# Patient Record
Sex: Female | Born: 1945 | Race: White | Hispanic: No | Smoking: Never smoker
Health system: Southern US, Community
[De-identification: ages and names within clinical notes are randomized; demographics above are authoritative.]

## PROBLEM LIST (undated history)

## (undated) DIAGNOSIS — R002 Palpitations: Secondary | ICD-10-CM

## (undated) DIAGNOSIS — N952 Postmenopausal atrophic vaginitis: Secondary | ICD-10-CM

## (undated) DIAGNOSIS — Z800 Family history of malignant neoplasm of digestive organs: Secondary | ICD-10-CM

## (undated) DIAGNOSIS — M8580 Other specified disorders of bone density and structure, unspecified site: Secondary | ICD-10-CM

## (undated) DIAGNOSIS — F419 Anxiety disorder, unspecified: Secondary | ICD-10-CM

## (undated) DIAGNOSIS — N200 Calculus of kidney: Secondary | ICD-10-CM

## (undated) DIAGNOSIS — H04129 Dry eye syndrome of unspecified lacrimal gland: Secondary | ICD-10-CM

## (undated) DIAGNOSIS — I340 Nonrheumatic mitral (valve) insufficiency: Secondary | ICD-10-CM

## (undated) DIAGNOSIS — K219 Gastro-esophageal reflux disease without esophagitis: Secondary | ICD-10-CM

## (undated) DIAGNOSIS — M810 Age-related osteoporosis without current pathological fracture: Secondary | ICD-10-CM

## (undated) HISTORY — DX: Palpitations: R00.2

## (undated) HISTORY — DX: Anxiety disorder, unspecified: F41.9

## (undated) HISTORY — DX: Other specified disorders of bone density and structure, unspecified site: M85.80

## (undated) HISTORY — DX: Family history of malignant neoplasm of digestive organs: Z80.0

## (undated) HISTORY — DX: Calculus of kidney: N20.0

## (undated) HISTORY — DX: Nonrheumatic mitral (valve) insufficiency: I34.0

## (undated) HISTORY — PX: BREAST CYST ASPIRATION: SHX578

## (undated) HISTORY — DX: Dry eye syndrome of unspecified lacrimal gland: H04.129

## (undated) HISTORY — DX: Postmenopausal atrophic vaginitis: N95.2

## (undated) HISTORY — DX: Gastro-esophageal reflux disease without esophagitis: K21.9

## (undated) HISTORY — DX: Age-related osteoporosis without current pathological fracture: M81.0

---

## 2002-09-03 HISTORY — PX: OVARIAN CYST REMOVAL: SHX89

## 2011-02-28 DIAGNOSIS — K219 Gastro-esophageal reflux disease without esophagitis: Secondary | ICD-10-CM | POA: Insufficient documentation

## 2011-02-28 HISTORY — DX: Gastro-esophageal reflux disease without esophagitis: K21.9

## 2012-09-03 HISTORY — PX: ABDOMINAL HYSTERECTOMY: SHX81

## 2013-03-02 DIAGNOSIS — Z800 Family history of malignant neoplasm of digestive organs: Secondary | ICD-10-CM | POA: Insufficient documentation

## 2013-03-02 DIAGNOSIS — F419 Anxiety disorder, unspecified: Secondary | ICD-10-CM

## 2013-03-02 HISTORY — DX: Anxiety disorder, unspecified: F41.9

## 2013-03-02 HISTORY — DX: Family history of malignant neoplasm of digestive organs: Z80.0

## 2013-05-25 LAB — HM DEXA SCAN

## 2013-09-01 DIAGNOSIS — N390 Urinary tract infection, site not specified: Secondary | ICD-10-CM | POA: Insufficient documentation

## 2013-09-01 DIAGNOSIS — N200 Calculus of kidney: Secondary | ICD-10-CM

## 2013-09-01 HISTORY — DX: Calculus of kidney: N20.0

## 2015-03-01 LAB — HM MAMMOGRAPHY

## 2016-06-21 ENCOUNTER — Ambulatory Visit: Payer: Self-pay | Admitting: Osteopathic Medicine

## 2016-06-22 ENCOUNTER — Encounter: Payer: Self-pay | Admitting: Osteopathic Medicine

## 2016-06-22 ENCOUNTER — Ambulatory Visit (INDEPENDENT_AMBULATORY_CARE_PROVIDER_SITE_OTHER): Payer: Medicare Other | Admitting: Osteopathic Medicine

## 2016-06-22 VITALS — BP 116/62 | HR 68 | Ht 62.0 in | Wt 126.0 lb

## 2016-06-22 DIAGNOSIS — Z0000 Encounter for general adult medical examination without abnormal findings: Secondary | ICD-10-CM | POA: Diagnosis not present

## 2016-06-22 DIAGNOSIS — Z23 Encounter for immunization: Secondary | ICD-10-CM | POA: Diagnosis not present

## 2016-06-22 DIAGNOSIS — H04129 Dry eye syndrome of unspecified lacrimal gland: Secondary | ICD-10-CM

## 2016-06-22 DIAGNOSIS — I340 Nonrheumatic mitral (valve) insufficiency: Secondary | ICD-10-CM

## 2016-06-22 DIAGNOSIS — M810 Age-related osteoporosis without current pathological fracture: Secondary | ICD-10-CM

## 2016-06-22 DIAGNOSIS — M8580 Other specified disorders of bone density and structure, unspecified site: Secondary | ICD-10-CM

## 2016-06-22 DIAGNOSIS — R002 Palpitations: Secondary | ICD-10-CM

## 2016-06-22 HISTORY — DX: Other specified disorders of bone density and structure, unspecified site: M85.80

## 2016-06-22 NOTE — Patient Instructions (Addendum)
Please have your pharmacy route as any refill request for medications you may need.  We will call you with lab results 1-2 business days after your blood draw, please have this done fasting at your convenience.  You should get a call to schedule mammogram and bone density test. Please let us know if you don't hear back about these procedures.  If there are any questions or concerns, please let us know! If everything is going well, plan to follow-up in one year for annual physical exam. If you would like lab work done before that visit, please call our office so that we can ensure orders are in place the lab.  Take care, and thank you for coming in today!
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
## 2016-06-24 DIAGNOSIS — H04129 Dry eye syndrome of unspecified lacrimal gland: Secondary | ICD-10-CM | POA: Insufficient documentation

## 2016-06-24 DIAGNOSIS — I340 Nonrheumatic mitral (valve) insufficiency: Secondary | ICD-10-CM | POA: Insufficient documentation

## 2016-06-24 DIAGNOSIS — M810 Age-related osteoporosis without current pathological fracture: Secondary | ICD-10-CM | POA: Insufficient documentation

## 2016-06-24 DIAGNOSIS — R002 Palpitations: Secondary | ICD-10-CM | POA: Insufficient documentation

## 2016-06-24 DIAGNOSIS — Z23 Encounter for immunization: Secondary | ICD-10-CM | POA: Insufficient documentation

## 2016-06-24 HISTORY — DX: Dry eye syndrome of unspecified lacrimal gland: H04.129

## 2016-06-24 HISTORY — DX: Age-related osteoporosis without current pathological fracture: M81.0

## 2016-06-24 HISTORY — DX: Palpitations: R00.2

## 2016-06-24 HISTORY — DX: Nonrheumatic mitral (valve) insufficiency: I34.0

## 2016-06-25 LAB — CBC WITH DIFFERENTIAL/PLATELET
BASOS ABS: 49 {cells}/uL (ref 0–200)
Basophils Relative: 1 %
EOS PCT: 2 %
Eosinophils Absolute: 98 cells/uL (ref 15–500)
HCT: 36 % (ref 35.0–45.0)
Hemoglobin: 11.8 g/dL (ref 11.7–15.5)
LYMPHS PCT: 33 %
Lymphs Abs: 1617 cells/uL (ref 850–3900)
MCH: 30.5 pg (ref 27.0–33.0)
MCHC: 32.8 g/dL (ref 32.0–36.0)
MCV: 93 fL (ref 80.0–100.0)
MONOS PCT: 10 %
MPV: 9.1 fL (ref 7.5–12.5)
Monocytes Absolute: 490 cells/uL (ref 200–950)
NEUTROS PCT: 54 %
Neutro Abs: 2646 cells/uL (ref 1500–7800)
PLATELETS: 245 10*3/uL (ref 140–400)
RBC: 3.87 MIL/uL (ref 3.80–5.10)
RDW: 13 % (ref 11.0–15.0)
WBC: 4.9 10*3/uL (ref 3.8–10.8)

## 2016-06-26 LAB — COMPLETE METABOLIC PANEL WITH GFR
ALK PHOS: 52 U/L (ref 33–130)
ALT: 11 U/L (ref 6–29)
AST: 18 U/L (ref 10–35)
Albumin: 4 g/dL (ref 3.6–5.1)
BUN: 18 mg/dL (ref 7–25)
CO2: 26 mmol/L (ref 20–31)
Calcium: 8.7 mg/dL (ref 8.6–10.4)
Chloride: 105 mmol/L (ref 98–110)
Creat: 0.66 mg/dL (ref 0.60–0.93)
GLUCOSE: 88 mg/dL (ref 65–99)
POTASSIUM: 4.2 mmol/L (ref 3.5–5.3)
SODIUM: 144 mmol/L (ref 135–146)
Total Bilirubin: 0.5 mg/dL (ref 0.2–1.2)
Total Protein: 6.3 g/dL (ref 6.1–8.1)

## 2016-06-26 LAB — LIPID PANEL
CHOL/HDL RATIO: 3.1 ratio (ref <=5.0)
Cholesterol: 194 mg/dL (ref 125–200)
HDL: 62 mg/dL (ref >=46)
LDL CALC: 117 mg/dL (ref <130)
TRIGLYCERIDES: 75 mg/dL (ref <150)
VLDL: 15 mg/dL (ref <30)

## 2016-06-26 LAB — HEPATITIS C ANTIBODY: HCV Ab: NEGATIVE

## 2016-06-26 LAB — VITAMIN D 25 HYDROXY (VIT D DEFICIENCY, FRACTURES): Vit D, 25-Hydroxy: 44 ng/mL (ref 30–100)

## 2016-06-26 LAB — TSH: TSH: 2.82 m[IU]/L

## 2016-07-04 ENCOUNTER — Ambulatory Visit (INDEPENDENT_AMBULATORY_CARE_PROVIDER_SITE_OTHER): Payer: Medicare Other

## 2016-07-04 DIAGNOSIS — M85851 Other specified disorders of bone density and structure, right thigh: Secondary | ICD-10-CM

## 2016-07-04 DIAGNOSIS — Z1231 Encounter for screening mammogram for malignant neoplasm of breast: Secondary | ICD-10-CM

## 2016-07-09 ENCOUNTER — Telehealth: Payer: Self-pay

## 2016-07-09 NOTE — Telephone Encounter (Signed)
Spoke to patient gave her advise as noted below. Rhonda Cunningham,CMA  

## 2016-07-09 NOTE — Telephone Encounter (Signed)
I don't have that report. I see where the mammogram is done, the radiologist may not have read it yet. If she hasn't heard anything about results by later this week, please call us back by Friday

## 2016-07-09 NOTE — Telephone Encounter (Signed)
I called patient to give her results and she stated that she got her Mammogram done  On the same day and she is requesting those results. .please advise. Rhonda Cunningham,CMA

## 2016-07-27 DIAGNOSIS — Z719 Counseling, unspecified: Secondary | ICD-10-CM | POA: Insufficient documentation

## 2016-10-11 ENCOUNTER — Ambulatory Visit: Payer: 59 | Admitting: Osteopathic Medicine

## 2016-10-12 ENCOUNTER — Encounter: Payer: Self-pay | Admitting: Osteopathic Medicine

## 2016-10-12 ENCOUNTER — Ambulatory Visit (INDEPENDENT_AMBULATORY_CARE_PROVIDER_SITE_OTHER): Payer: Medicare HMO | Admitting: Osteopathic Medicine

## 2016-10-12 VITALS — BP 109/58 | HR 74 | Ht 62.0 in | Wt 126.0 lb

## 2016-10-12 DIAGNOSIS — H04123 Dry eye syndrome of bilateral lacrimal glands: Secondary | ICD-10-CM | POA: Diagnosis not present

## 2016-10-12 NOTE — Patient Instructions (Addendum)
Placed referral - if this doesn't go through, let me know and I'll try to communicate directly with Dr. Jodi Mourning to get this solved.

## 2016-10-12 NOTE — Progress Notes (Signed)
HPI: Jamie Stephens is a 71 y.o. female  who presents to [facility] today, 10/12/16,  for chief complaint of:  Chief Complaint  Patient presents with  . Eye Problem    request referral for dry eye    Dry eye - ongoing for years. She states that insurance company requires a primary care referral to the optometrist (with whom she is already established) for completion of a procedure to help correct or alleviate dry eye. Had the most recent office notes from Dr. Jodi Mourning, dated 10/11/2016. Consider Lipiflow procedure, listed in treatment plan for dry eye. Also continue with Restasis twice a day.    Past medical history, surgical history, social history and family history reviewed.  Patient Active Problem List   Diagnosis Date Noted  . Palpitations 06/24/2016  . Non-rheumatic mitral regurgitation 06/24/2016  . Need for diphtheria-tetanus-pertussis (Tdap) vaccine, adult/adolescent 06/24/2016  . Osteoporosis without current pathological fracture 06/24/2016  . Dry eye 06/24/2016  . Osteopenia 06/22/2016    Current medication list and allergy/intolerance information reviewed.   Current Outpatient Prescriptions on File Prior to Visit  Medication Sig Dispense Refill  . alendronate (FOSAMAX) 35 MG tablet      No current facility-administered medications on file prior to visit.    No Known Allergies    Review of Systems:  Constitutional: No recent illness  HEENT: No  headache, no vision change. (+)dry eye - severe   Exam:  BP (!) 109/58   Pulse 74   Ht 5\' 2"  (1.575 m)   Wt 126 lb (57.2 kg)   BMI 23.05 kg/m   Constitutional: VS see above. General Appearance: alert, well-developed, well-nourished, NAD  Eyes: Normal lids, PERRLA, nonicteric sclera. Left eye shows some redness to the conjunctiva /blood vessels.  Ears, Nose, Mouth, Throat: MMM, Normal external inspection ears/nares/mouth/lips/gums.     ASSESSMENT/PLAN: See referral notes for  full details. Referral placed per patient request. She is advised that insurance company should contact her optometrist if there is consideration for peer to peer review or other qualifying process for the proposed treatment as I am not familiar with this procedure.  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 Jodi Mourning to get this solved.     Follow-up plan: Return if symptoms worsen or fail to improve.  Visit summary with medication list and pertinent instructions was printed for patient to review, alert [place] if any changes needed. All questions at time of visit were answered - patient instructed to contact office with any additional concerns. ER/RTC precautions were reviewed with the patient and understanding verbalized.

## 2016-10-19 DIAGNOSIS — H04123 Dry eye syndrome of bilateral lacrimal glands: Secondary | ICD-10-CM | POA: Diagnosis not present

## 2017-02-05 DIAGNOSIS — L989 Disorder of the skin and subcutaneous tissue, unspecified: Secondary | ICD-10-CM | POA: Insufficient documentation

## 2017-02-05 DIAGNOSIS — D485 Neoplasm of uncertain behavior of skin: Secondary | ICD-10-CM | POA: Diagnosis not present

## 2017-02-08 ENCOUNTER — Ambulatory Visit (INDEPENDENT_AMBULATORY_CARE_PROVIDER_SITE_OTHER): Payer: Medicare HMO | Admitting: Sports Medicine

## 2017-02-08 ENCOUNTER — Encounter: Payer: Self-pay | Admitting: Sports Medicine

## 2017-02-08 DIAGNOSIS — N3090 Cystitis, unspecified without hematuria: Secondary | ICD-10-CM | POA: Diagnosis not present

## 2017-02-08 MED ORDER — PHENAZOPYRIDINE HCL 200 MG PO TABS: 200.0000 mg | ORAL_TABLET | Freq: Three times a day (TID) | ORAL | 0 refills | Status: AC

## 2017-02-08 MED ORDER — CEFUROXIME AXETIL 500 MG PO TABS: 500.0000 mg | ORAL_TABLET | Freq: Two times a day (BID) | ORAL | 0 refills | Status: DC

## 2017-02-08 MED ORDER — SULFAMETHOXAZOLE-TRIMETHOPRIM 800-160 MG PO TABS: 1.0000 | ORAL_TABLET | Freq: Two times a day (BID) | ORAL | 0 refills | Status: DC

## 2017-02-08 NOTE — Assessment & Plan Note (Addendum)
Classic cystitis with pyuria, previous urine cultures have grown out nearly pansensitive Escherichia coli and Klebsiella resistant only to first generation cephalosporins. Per her request we are going to use sulfamethoxazole/trimethoprim, she was unable to provide enough urine for dipstick so we are only going to do a culture.

## 2017-02-08 NOTE — Progress Notes (Signed)
  Subjective:    CC:  Urinary tract infection  HPI: This is a pleasant 71 year old female, she has a history of urinary tract infections in the past, more recently for the past several days she's had superpubic pain with dysuria, frequency, urgency and hesitancy. No flank pain, no constitutional symptoms, she is requesting Septra. Previous urine cultures have grown out Escherichia coli and Klebsiella, for the most part pansensitive with the exception of first generation cephalosporins.  Past medical history:  Negative.  See flowsheet/record as well for more information.  Surgical history: Negative.  See flowsheet/record as well for more information.  Family history: Negative.  See flowsheet/record as well for more information.  Social history: Negative.  See flowsheet/record as well for more information.  Allergies, and medications have been entered into the medical record, reviewed, and no changes needed.   Review of Systems: No fevers, chills, night sweats, weight loss, chest pain, or shortness of breath.   Objective:    General: Well Developed, well nourished, and in no acute distress.  Neuro: Alert and oriented x3, extra-ocular muscles intact, sensation grossly intact.  HEENT: Normocephalic, atraumatic, pupils equal round reactive to light, neck supple, no masses, no lymphadenopathy, thyroid nonpalpable.  Skin: Warm and dry, no rashes. Cardiac: Regular rate and rhythm, no murmurs rubs or gallops, no lower extremity edema.  Respiratory: Clear to auscultation bilaterally. Not using accessory muscles, speaking in full sentences. Abdomen: Soft, nontender, nondistended, no bowel sounds, no palpable masses, no guarding, rigidity, rebound pain, no costovertebral angle tenderness.  She didn't provide enough urine for urinalysis so we did send it off for culture.  Impression and Recommendations:    Cystitis Classic cystitis with pyuria, previous urine cultures have grown out nearly  pansensitive Escherichia coli and Klebsiella resistant only to first generation cephalosporins. Per her request we are going to use sulfamethoxazole/trimethoprim, she was unable to provide enough urine for dipstick so we are only going to do a culture.

## 2017-02-09 LAB — URINE CULTURE

## 2017-03-12 DIAGNOSIS — L570 Actinic keratosis: Secondary | ICD-10-CM | POA: Diagnosis not present

## 2017-05-31 DIAGNOSIS — Z961 Presence of intraocular lens: Secondary | ICD-10-CM | POA: Diagnosis not present

## 2017-05-31 DIAGNOSIS — L718 Other rosacea: Secondary | ICD-10-CM | POA: Diagnosis not present

## 2017-05-31 DIAGNOSIS — H52223 Regular astigmatism, bilateral: Secondary | ICD-10-CM | POA: Diagnosis not present

## 2017-05-31 DIAGNOSIS — H43813 Vitreous degeneration, bilateral: Secondary | ICD-10-CM | POA: Diagnosis not present

## 2017-05-31 DIAGNOSIS — H524 Presbyopia: Secondary | ICD-10-CM | POA: Diagnosis not present

## 2017-05-31 DIAGNOSIS — H04123 Dry eye syndrome of bilateral lacrimal glands: Secondary | ICD-10-CM | POA: Diagnosis not present

## 2017-05-31 DIAGNOSIS — H5203 Hypermetropia, bilateral: Secondary | ICD-10-CM | POA: Diagnosis not present

## 2017-07-04 ENCOUNTER — Ambulatory Visit (INDEPENDENT_AMBULATORY_CARE_PROVIDER_SITE_OTHER): Payer: Medicare HMO | Admitting: Osteopathic Medicine

## 2017-07-04 ENCOUNTER — Encounter: Payer: Self-pay | Admitting: Osteopathic Medicine

## 2017-07-04 VITALS — BP 102/61 | HR 88 | Ht 62.0 in | Wt 125.0 lb

## 2017-07-04 DIAGNOSIS — Z1331 Encounter for screening for depression: Secondary | ICD-10-CM

## 2017-07-04 DIAGNOSIS — R7989 Other specified abnormal findings of blood chemistry: Secondary | ICD-10-CM | POA: Diagnosis not present

## 2017-07-04 DIAGNOSIS — N3090 Cystitis, unspecified without hematuria: Secondary | ICD-10-CM | POA: Diagnosis not present

## 2017-07-04 DIAGNOSIS — M810 Age-related osteoporosis without current pathological fracture: Secondary | ICD-10-CM

## 2017-07-04 DIAGNOSIS — Z0000 Encounter for general adult medical examination without abnormal findings: Secondary | ICD-10-CM | POA: Diagnosis not present

## 2017-07-04 DIAGNOSIS — R002 Palpitations: Secondary | ICD-10-CM

## 2017-07-04 DIAGNOSIS — R3915 Urgency of urination: Secondary | ICD-10-CM | POA: Diagnosis not present

## 2017-07-04 DIAGNOSIS — B351 Tinea unguium: Secondary | ICD-10-CM | POA: Diagnosis not present

## 2017-07-04 LAB — URINALYSIS, ROUTINE W REFLEX MICROSCOPIC
Bilirubin Urine: NEGATIVE
GLUCOSE, UA: NEGATIVE
HGB URINE DIPSTICK: NEGATIVE
HYALINE CAST: NONE SEEN /LPF
Ketones, ur: NEGATIVE
Nitrite: POSITIVE — AB
PH: 6.5 (ref 5.0–8.0)
Protein, ur: NEGATIVE
RBC / HPF: NONE SEEN /HPF (ref 0–2)
Specific Gravity, Urine: 1.023 (ref 1.001–1.03)
Squamous Epithelial / LPF: NONE SEEN /HPF (ref <=5)

## 2017-07-04 MED ORDER — ALENDRONATE SODIUM 35 MG PO TABS: 35.0000 mg | ORAL_TABLET | ORAL | 1 refills | Status: DC

## 2017-07-04 MED ORDER — MIRABEGRON ER 25 MG PO TB24: 25.0000 mg | ORAL_TABLET | Freq: Every day | ORAL | 3 refills | Status: DC

## 2017-07-04 MED ORDER — CICLOPIROX 8 % EX SOLN: Freq: Every day | CUTANEOUS | 0 refills | Status: DC

## 2017-07-04 NOTE — Progress Notes (Addendum)
Subjective:   Jamie Stephens is a 71 y.o. female who presents for Medicare Annual (Subsequent) preventive examination.    Patient here for annual physical / wellness exam.  See preventive care reviewed as below.  Recent labs reviewed in detail.   06/25/16: cholesterol ok LDL 117 HDL 62, ViD 44, TSH nml, CMP & CBC ok  Additional concerns today include:   Heart palpitations in the evenings/nighttime, thinks might be anxiety-related. 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 Feels like has to go pee but then nothing really coming out. No incontinence. Would like to try a medication.   Fungus in fingernail on L finger. Previously on pills for this. Now using medicated band-aids, not helping much.     Review of Systems: Review of Systems - Negative except as per HPI   Medicare Wellness Questionnaire  Are there smokers in your home (other than you)? no  Depression Screen (Note: if answer to either of the following is "Yes", a more complete depression screening is indicated)   Q1: Over the past two weeks, have you felt down, depressed or hopeless? yes  Q2: Over the past two weeks, have you felt little interest or pleasure in doing things? yes  Have you lost interest or pleasure in daily life? no  Do you often feel hopeless? no  Do you cry easily over simple problems? no  Activities of Daily Living In your present state of health, do you have any difficulty performing the following activities?:  Driving? no Managing money?  no Feeding yourself? no Getting from bed to chair? no Climbing a flight of stairs? no Preparing food and eating?: no Bathing or showering? no Getting dressed: no Getting to the toilet? no Using the toilet: no Moving around from place to place: no In the past year have you fallen or had a near fall?: no  Hearing  Difficulties:  Do you often ask people to speak up or repeat themselves? no Do you experience ringing or noises in your ears? no  Do you have difficulty understanding soft or whispered voices? no  Memory Difficulties:  Do you feel that you have a problem with memory? no  Do you often misplace items? no  Do you feel safe at home?  yes  Sexual Health:   Are you sexually active?  YNo  Do you have more than one partner?  No  Advanced Directives:   Advanced directives discussed: has NO advanced directive  - add't info requested. Referral to SW: none needed  Additional information provided: yes  Exercise: stationary bike, active, walking Diet: no concerns  Other Doctors: Dr Ardis Hughs at [facility]            Objective:     Vitals: BP 102/61   Pulse 88   Ht 5\' 2"  (1.575 m)   Wt 125 lb (56.7 kg)   BMI 22.86 kg/m   There is no height or weight on file to calculate BMI.    Exam:   Constitutional: VS see above. General Appearance: alert, well-developed, well-nourished, NAD  Ears, Nose, Mouth, Throat: MMM  Neck: No masses, trachea midline.   Respiratory: Normal respiratory effort. no wheeze, no rhonchi, no rales  Cardiovascular:No lower extremity edema.   Musculoskeletal: Gait normal. No clubbing/cyanosis of digits.   Neurological: Normal balance/coordination. No tremor.  Skin: warm, dry, intact. No rash/ulcer. (+)nail fungus L 2nd  fingernail   Psychiatric: Normal judgment/insight. Normal mood and affect. Oriented x3.    Tobacco History  Smoking Status  . Never Smoker  Smokeless Tobacco  . Never Used     Counseling given: Not Answered   No past medical history on file. Past Surgical History:  Procedure Laterality Date  . ABDOMINAL HYSTERECTOMY  2014   PARTIAL W/BLADDER TACK  . OVARIAN CYST REMOVAL  2004   Family History  Problem Relation Age of Onset  . Cancer Mother        COLON   History  Sexual Activity  . Sexual activity: Not on file     Outpatient Encounter Prescriptions as of 07/04/2017  Medication Sig  . alendronate (FOSAMAX) 35 MG tablet   . sulfamethoxazole-trimethoprim (BACTRIM DS,SEPTRA DS) 800-160 MG tablet Take 1 tablet by mouth 2 (two) times daily.   No facility-administered encounter medications on file as of 07/04/2017.     Patient Care Team: Emeterio Reeve, DO as PCP - General (Osteopathic Medicine)    Assessment:    Encounter for Medicare annual wellness exam  Intermittent palpitations - Plan: ECHOCARDIOGRAM COMPLETE, CBC, COMPLETE METABOLIC PANEL WITH GFR, Lipid panel, TSH  Urinary urgency - Plan: Urine Culture, Urinalysis, Routine w reflex microscopic, mirabegron ER (MYRBETRIQ) 25 MG TB24 tablet  Osteoporosis without current pathological fracture, unspecified osteoporosis type - Plan: alendronate (FOSAMAX) 35 MG tablet, VITAMIN D 25 Hydroxy (Vit-D Deficiency, Fractures)  Onychomycosis - Plan: ciclopirox (PENLAC) 8 % solution  Positive depression screening - discussed - pt feels ok, not interested in meds/counseling at ths time, knows to talk to me if worse       Goals    None     Fall Risk Fall Risk  06/22/2016  Falls in the past year? No   Depression Screen PHQ 2/9 Scores 06/22/2016  PHQ - 2 Score 0     Cognitive Function 6CIT Screen 07/04/2017  What Year? 0 points  What month? 0 points  What time? 0 points  Count back from 20 0 points  Months in reverse 0 points  Repeat phrase 2 points  Total Score 2             Screening Tests Health Maintenance  Topic Date Due  . PNA vac Low Risk Adult (2 of 2 - PPSV23) 09/18/2015  . MAMMOGRAM  01/01/2017  . INFLUENZA VACCINE  04/03/2017  . DEXA SCAN  07/04/2018  . COLONOSCOPY  05/10/2025  . TETANUS/TDAP  06/22/2026  . Hepatitis C Screening  Completed      Plan:     FEMALE PREVENTIVE CARE Updated 07/04/17   ANNUAL SCREENING/COUNSELING  Diet/Exercise - HEALTHY HABITS DISCUSSED TO DECREASE CV RISK History   Smoking Status  . Never Smoker  Smokeless Tobacco  . Never Used   History  Alcohol use Not on file   Depression screen [facility] 2/9 06/22/2016  Decreased Interest 0  Down, Depressed, Hopeless 0  PHQ - 2 Score 0    Domestic violence concerns - no  HTN SCREENING - SEE [place]  Sexually active in the past year - No  Need/want STI testing today? - no  INFECTIOUS DISEASE SCREENING  HIV - does not need  GC/CT - does not need  HepC - DOB [birth year] - does not need  TB - does not need  DISEASE SCREENING  Lipid - needs  DM2 - needs  Osteoporosis - women age 32+ - does not need  CANCER SCREENING  Cervical - does  not need  Breast - needs  Lung - does not need  Colon - does not need  ADULT VACCINATION  Influenza - annual vaccine recommended  Td - booster every 10 years   Zoster - Shingrix recommended 50+  PCV13 - already has  PPSV23 - already has Immunization History  Administered Date(s) Administered  . Pneumococcal Conjugate-13 04/20/2014  . Pneumococcal Polysaccharide-23 09/17/2010  . Tdap 03/03/2005, 06/22/2016  . Zoster 11/19/2011    OTHER  Fall injury prevention - exercise and Vit D age 74+ - needs    I have personally reviewed and noted the following in the patient's chart:   . Medical and social history . Use of alcohol, tobacco or illicit drugs  . Current medications and supplements . Functional ability and status . Nutritional status . Physical activity . Advanced directives . List of other physicians . Hospitalizations, surgeries, and ER visits in previous 12 months . Vitals . Screenings to include cognitive, depression, and falls . Referrals and appointments  In addition, I have reviewed and discussed with patient certain preventive protocols, quality metrics, and best practice recommendations. A written personalized care plan for preventive services as well as general preventive health recommendations were provided  to patient.     Emeterio Reeve, DO  07/04/2017   Total time spent o nproblem-based portion of visit,  25 minutes, greater than 50% of that timt was counseling and coordinating care for diagnosis of of Intermittent palpitations, Urinary urgency, Osteoporosis without current pathological fracture, unspecified osteoporosis type, Onychomycosis, and Positive depression screening     Addendum 07/05/17   Results for orders placed or performed in visit on 07/04/17 (from the past 24 hour(s))  CBC     Status: None   Collection Time: 07/04/17 10:44 AM  Result Value Ref Range   WBC 5.0 3.8 - 10.8 Thousand/uL   RBC 3.89 3.80 - 5.10 Million/uL   Hemoglobin 12.0 11.7 - 15.5 g/dL   HCT 35.5 35.0 - 45.0 %   MCV 91.3 80.0 - 100.0 fL   MCH 30.8 27.0 - 33.0 pg   MCHC 33.8 32.0 - 36.0 g/dL   RDW 11.8 11.0 - 15.0 %   Platelets 239 140 - 400 Thousand/uL   MPV 9.4 7.5 - 12.5 fL  COMPLETE METABOLIC PANEL WITH GFR     Status: None   Collection Time: 07/04/17 10:44 AM  Result Value Ref Range   Glucose, Bld 85 65 - 99 mg/dL   BUN 17 7 - 25 mg/dL   Creat 0.78 0.60 - 0.93 mg/dL   GFR, Est Non African American 76 > OR = 60 mL/min/1.37m2   GFR, Est African American 89 > OR = 60 mL/min/1.37m2   BUN/Creatinine Ratio NOT APPLICABLE 6 - 22 (calc)   Sodium 142 135 - 146 mmol/L   Potassium 4.1 3.5 - 5.3 mmol/L   Chloride 108 98 - 110 mmol/L   CO2 26 20 - 32 mmol/L   Calcium 8.9 8.6 - 10.4 mg/dL   Total Protein 6.4 6.1 - 8.1 g/dL   Albumin 4.1 3.6 - 5.1 g/dL   Globulin 2.3 1.9 - 3.7 g/dL (calc)   AG Ratio 1.8 1.0 - 2.5 (calc)   Total Bilirubin 0.7 0.2 - 1.2 mg/dL   Alkaline phosphatase (APISO) 54 33 - 130 U/L   AST 18 10 - 35 U/L   ALT 14 6 - 29 U/L  Lipid panel     Status: Abnormal   Collection Time: 07/04/17 10:44 AM  Result  Value Ref Range   Cholesterol 195 <200 mg/dL   HDL 74 >50 mg/dL   Triglycerides 105 <150 mg/dL   LDL Cholesterol (Calc) 101 (H) mg/dL (calc)   Total CHOL/HDL Ratio 2.6 <5.0  (calc)   Non-HDL Cholesterol (Calc) 121 <130 mg/dL (calc)  VITAMIN D 25 Hydroxy (Vit-D Deficiency, Fractures)     Status: None   Collection Time: 07/04/17 10:44 AM  Result Value Ref Range   Vit D, 25-Hydroxy 61 30 - 100 ng/mL  TSH     Status: None   Collection Time: 07/04/17 10:44 AM  Result Value Ref Range   TSH 2.81 0.40 - 4.50 mIU/L  Urinalysis, Routine w reflex microscopic     Status: Abnormal   Collection Time: 07/04/17 10:46 AM  Result Value Ref Range   Color, Urine YELLOW YELLOW   APPearance CLEAR CLEAR   Specific Gravity, Urine 1.023 1.001 - 1.03   pH 6.5 5.0 - 8.0   Glucose, UA NEGATIVE NEGATIVE   Bilirubin Urine NEGATIVE NEGATIVE   Ketones, ur NEGATIVE NEGATIVE   Hgb urine dipstick NEGATIVE NEGATIVE   Protein, ur NEGATIVE NEGATIVE   Nitrite POSITIVE (A) NEGATIVE   Leukocytes, UA 1+ (A) NEGATIVE   WBC, UA 0-5 0 - 5 /HPF   RBC / HPF NONE SEEN 0 - 2 /HPF   Squamous Epithelial / LPF NONE SEEN < OR = 5 /HPF   Bacteria, UA FEW (A) NONE SEEN /HPF   Hyaline Cast NONE SEEN NONE SEEN /LPF   Cystitis: sent cipro since sx >1 week

## 2017-07-04 NOTE — Patient Instructions (Addendum)
Please note: Preventive care issues were addressed today per annual physical requirements and should be 100% covered under your insurance, however there were other medical issues which were also addressed and insurance may bill you separately for "problem-based visit." Any questions or concerns about charges which may appear under your billing statement should be directed to your insurance company or to [facility] billing department, please contact our office with any other questions.    Health Maintenance, Female Adopting a healthy lifestyle and getting preventive care can go a long way to promote health and wellness. Talk with your health care provider about what schedule of regular examinations is right for you. This is a good chance for you to check in with your provider about disease prevention and staying healthy. In between checkups, there are plenty of things you can do on your own. Experts have done a lot of research about which lifestyle changes and preventive measures are most likely to keep you healthy. Ask your health care provider for more information. Weight and diet Eat a healthy diet  Be sure to include plenty of vegetables, fruits, low-fat dairy products, and lean protein.  Do not eat a lot of foods high in solid fats, added sugars, or salt.  Get regular exercise. This is one of the most important things you can do for your health. ? Most adults should exercise for at least 150 minutes each week. The exercise should increase your heart rate and make you sweat (moderate-intensity exercise). ? Most adults should also do strengthening exercises at least twice a week. This is in addition to the moderate-intensity exercise.  Maintain a healthy weight  Body mass index (BMI) is a measurement that can be used to identify possible weight problems. It estimates body fat based on height and weight. Your health care provider can help determine your BMI and help you achieve or maintain a  healthy weight.  For females 34 years of age and older: ? A BMI below 18.5 is considered underweight. ? A BMI of 18.5 to 24.9 is normal. ? A BMI of 25 to 29.9 is considered overweight. ? A BMI of 30 and above is considered obese.  Watch levels of cholesterol and blood lipids  You should start having your blood tested for lipids and cholesterol at 71 years of age, then have this test every 5 years.  You may need to have your cholesterol levels checked more often if: ? Your lipid or cholesterol levels are high. ? You are older than 71 years of age. ? You are at high risk for heart disease.  Cancer screening Lung Cancer  Lung cancer screening is recommended for adults 25-7 years old who are at high risk for lung cancer because of a history of smoking.  A yearly low-dose CT scan of the lungs is recommended for people who: ? Currently smoke. ? Have quit within the past 15 years. ? Have at least a 30-pack-year history of smoking. A pack year is smoking an average of one pack of cigarettes a day for 1 year.  Yearly screening should continue until it has been 15 years since you quit.  Yearly screening should stop if you develop a health problem that would prevent you from having lung cancer treatment.  Breast Cancer  Practice breast self-awareness. This means understanding how your breasts normally appear and feel.  It also means doing regular breast self-exams. Let your health care provider know about any changes, no matter how small.  If you  are in your 20s or 30s, you should have a clinical breast exam (CBE) by a health care provider every 1-3 years as part of a regular health exam.  If you are 69 or older, have a CBE every year. Also consider having a breast X-ray (mammogram) every year.  If you have a family history of breast cancer, talk to your health care provider about genetic screening.  If you are at high risk for breast cancer, talk to your health care provider about  having an MRI and a mammogram every year.  Breast cancer gene (BRCA) assessment is recommended for women who have family members with BRCA-related cancers. BRCA-related cancers include: ? Breast. ? Ovarian. ? Tubal. ? Peritoneal cancers.  Results of the assessment will determine the need for genetic counseling and BRCA1 and BRCA2 testing.  Cervical Cancer Your health care provider may recommend that you be screened regularly for cancer of the pelvic organs (ovaries, uterus, and vagina). This screening involves a pelvic examination, including checking for microscopic changes to the surface of your cervix (Pap test). You may be encouraged to have this screening done every 3 years, beginning at age 33.  For women ages 46-65, health care providers may recommend pelvic exams and Pap testing every 3 years, or they may recommend the Pap and pelvic exam, combined with testing for human papilloma virus (HPV), every 5 years. Some types of HPV increase your risk of cervical cancer. Testing for HPV may also be done on women of any age with unclear Pap test results.  Other health care providers may not recommend any screening for nonpregnant women who are considered low risk for pelvic cancer and who do not have symptoms. Ask your health care provider if a screening pelvic exam is right for you.  If you have had past treatment for cervical cancer or a condition that could lead to cancer, you need Pap tests and screening for cancer for at least 20 years after your treatment. If Pap tests have been discontinued, your risk factors (such as having a new sexual partner) need to be reassessed to determine if screening should resume. Some women have medical problems that increase the chance of getting cervical cancer. In these cases, your health care provider may recommend more frequent screening and Pap tests.  Colorectal Cancer  This type of cancer can be detected and often prevented.  Routine colorectal cancer  screening usually begins at 71 years of age and continues through 71 years of age.  Your health care provider may recommend screening at an earlier age if you have risk factors for colon cancer.  Your health care provider may also recommend using home test kits to check for hidden blood in the stool.  A small camera at the end of a tube can be used to examine your colon directly (sigmoidoscopy or colonoscopy). This is done to check for the earliest forms of colorectal cancer.  Routine screening usually begins at age 69.  Direct examination of the colon should be repeated every 5-10 years through 71 years of age. However, you may need to be screened more often if early forms of precancerous polyps or small growths are found.  Skin Cancer  Check your skin from head to toe regularly.  Tell your health care provider about any new moles or changes in moles, especially if there is a change in a mole's shape or color.  Also tell your health care provider if you have a mole that is larger than  the size of a pencil eraser.  Always use sunscreen. Apply sunscreen liberally and repeatedly throughout the day.  Protect yourself by wearing long sleeves, pants, a wide-brimmed hat, and sunglasses whenever you are outside.  Heart disease, diabetes, and high blood pressure  High blood pressure causes heart disease and increases the risk of stroke. High blood pressure is more likely to develop in: ? People who have blood pressure in the high end of the normal range (130-139/85-89 mm Hg). ? People who are overweight or obese. ? People who are African American.  If you are 42-50 years of age, have your blood pressure checked every 3-5 years. If you are 63 years of age or older, have your blood pressure checked every year. You should have your blood pressure measured twice-once when you are at a hospital or clinic, and once when you are not at a hospital or clinic. Record the average of the two measurements.  To check your blood pressure when you are not at a hospital or clinic, you can use: ? An automated blood pressure machine at a pharmacy. ? A home blood pressure monitor.  If you are between 16 years and 6 years old, ask your health care provider if you should take aspirin to prevent strokes.  Have regular diabetes screenings. This involves taking a blood sample to check your fasting blood sugar level. ? If you are at a normal weight and have a low risk for diabetes, have this test once every three years after 71 years of age. ? If you are overweight and have a high risk for diabetes, consider being tested at a younger age or more often. Preventing infection Hepatitis B  If you have a higher risk for hepatitis B, you should be screened for this virus. You are considered at high risk for hepatitis B if: ? You were born in a country where hepatitis B is common. Ask your health care provider which countries are considered high risk. ? Your parents were born in a high-risk country, and you have not been immunized against hepatitis B (hepatitis B vaccine). ? You have HIV or AIDS. ? You use needles to inject street drugs. ? You live with someone who has hepatitis B. ? You have had sex with someone who has hepatitis B. ? You get hemodialysis treatment. ? You take certain medicines for conditions, including cancer, organ transplantation, and autoimmune conditions.  Hepatitis C  Blood testing is recommended for: ? Everyone born from [birth year] through 1965. ? Anyone with known risk factors for hepatitis C.  Sexually transmitted infections (STIs)  You should be screened for sexually transmitted infections (STIs) including gonorrhea and chlamydia if: ? You are sexually active and are younger than 71 years of age. ? You are older than 71 years of age and your health care provider tells you that you are at risk for this type of infection. ? Your sexual activity has changed since you were last screened  and you are at an increased risk for chlamydia or gonorrhea. Ask your health care provider if you are at risk.  If you do not have HIV, but are at risk, it may be recommended that you take a prescription medicine daily to prevent HIV infection. This is called pre-exposure prophylaxis (PrEP). You are considered at risk if: ? You are sexually active and do not regularly use condoms or know the HIV status of your partner(s). ? You take drugs by injection. ? You are sexually active with a  partner who has HIV.  Talk with your health care provider about whether you are at high risk of being infected with HIV. If you choose to begin PrEP, you should first be tested for HIV. You should then be tested every 3 months for as long as you are taking PrEP. Pregnancy  If you are premenopausal and you may become pregnant, ask your health care provider about preconception counseling.  If you may become pregnant, take 400 to 800 micrograms (mcg) of folic acid every day.  If you want to prevent pregnancy, talk to your health care provider about birth control (contraception). Osteoporosis and menopause  Osteoporosis is a disease in which the bones lose minerals and strength with aging. This can result in serious bone fractures. Your risk for osteoporosis can be identified using a bone density scan.  If you are 70 years of age or older, or if you are at risk for osteoporosis and fractures, ask your health care provider if you should be screened.  Ask your health care provider whether you should take a calcium or vitamin D supplement to lower your risk for osteoporosis.  Menopause may have certain physical symptoms and risks.  Hormone replacement therapy may reduce some of these symptoms and risks. Talk to your health care provider about whether hormone replacement therapy is right for you. Follow these instructions at home:  Schedule regular health, dental, and eye exams.  Stay current with your  immunizations.  Do not use any tobacco products including cigarettes, chewing tobacco, or electronic cigarettes.  If you are pregnant, do not drink alcohol.  If you are breastfeeding, limit how much and how often you drink alcohol.  Limit alcohol intake to no more than 1 drink per day for nonpregnant women. One drink equals 12 ounces of beer, 5 ounces of wine, or 1 ounces of hard liquor.  Do not use street drugs.  Do not share needles.  Ask your health care provider for help if you need support or information about quitting drugs.  Tell your health care provider if you often feel depressed.  Tell your health care provider if you have ever been abused or do not feel safe at home. This information is not intended to replace advice given to you by your health care provider. Make sure you discuss any questions you have with your health care provider. Document Released: 03/05/2011 Document Revised: 01/26/2016 Document Reviewed: 05/24/2015 Elsevier Interactive Patient Education  [employer].

## 2017-07-05 LAB — CBC
HCT: 35.5 % (ref 35.0–45.0)
HEMOGLOBIN: 12 g/dL (ref 11.7–15.5)
MCH: 30.8 pg (ref 27.0–33.0)
MCHC: 33.8 g/dL (ref 32.0–36.0)
MCV: 91.3 fL (ref 80.0–100.0)
MPV: 9.4 fL (ref 7.5–12.5)
PLATELETS: 239 10*3/uL (ref 140–400)
RBC: 3.89 10*6/uL (ref 3.80–5.10)
RDW: 11.8 % (ref 11.0–15.0)
WBC: 5 10*3/uL (ref 3.8–10.8)

## 2017-07-05 LAB — LIPID PANEL
Cholesterol: 195 mg/dL (ref <200)
HDL: 74 mg/dL (ref >50)
LDL CHOLESTEROL (CALC): 101 mg/dL — AB
NON-HDL CHOLESTEROL (CALC): 121 mg/dL (ref <130)
TRIGLYCERIDES: 105 mg/dL (ref <150)
Total CHOL/HDL Ratio: 2.6 (calc) (ref <5.0)

## 2017-07-05 LAB — COMPLETE METABOLIC PANEL WITH GFR
AG RATIO: 1.8 (calc) (ref 1.0–2.5)
ALBUMIN MSPROF: 4.1 g/dL (ref 3.6–5.1)
ALT: 14 U/L (ref 6–29)
AST: 18 U/L (ref 10–35)
Alkaline phosphatase (APISO): 54 U/L (ref 33–130)
BILIRUBIN TOTAL: 0.7 mg/dL (ref 0.2–1.2)
BUN: 17 mg/dL (ref 7–25)
CALCIUM: 8.9 mg/dL (ref 8.6–10.4)
CO2: 26 mmol/L (ref 20–32)
Chloride: 108 mmol/L (ref 98–110)
Creat: 0.78 mg/dL (ref 0.60–0.93)
GFR, EST AFRICAN AMERICAN: 89 mL/min/{1.73_m2} (ref >=60)
GFR, EST NON AFRICAN AMERICAN: 76 mL/min/{1.73_m2} (ref >=60)
GLUCOSE: 85 mg/dL (ref 65–99)
Globulin: 2.3 g/dL (calc) (ref 1.9–3.7)
Potassium: 4.1 mmol/L (ref 3.5–5.3)
Sodium: 142 mmol/L (ref 135–146)
TOTAL PROTEIN: 6.4 g/dL (ref 6.1–8.1)

## 2017-07-05 LAB — VITAMIN D 25 HYDROXY (VIT D DEFICIENCY, FRACTURES): VIT D 25 HYDROXY: 61 ng/mL (ref 30–100)

## 2017-07-05 LAB — TSH: TSH: 2.81 m[IU]/L (ref 0.40–4.50)

## 2017-07-05 MED ORDER — CIPROFLOXACIN HCL 500 MG PO TABS: 500.0000 mg | ORAL_TABLET | Freq: Two times a day (BID) | ORAL | 0 refills | Status: DC

## 2017-07-05 NOTE — Addendum Note (Signed)
Addended by: Maryla Morrow on: 07/05/2017 09:38 AM   Modules accepted: Orders

## 2017-07-07 LAB — URINE CULTURE
MICRO NUMBER:: 81227238
SPECIMEN QUALITY: ADEQUATE

## 2017-07-08 ENCOUNTER — Other Ambulatory Visit: Payer: Self-pay | Admitting: Osteopathic Medicine

## 2017-07-19 ENCOUNTER — Other Ambulatory Visit: Payer: Self-pay | Admitting: Osteopathic Medicine

## 2017-07-19 DIAGNOSIS — R8290 Unspecified abnormal findings in urine: Secondary | ICD-10-CM | POA: Insufficient documentation

## 2017-07-19 NOTE — Progress Notes (Signed)
Referral for Achrombacter xylosoxidans/denitrificans urine culture result - please send culture result with referral notes, thanks

## 2017-07-31 DIAGNOSIS — R3915 Urgency of urination: Secondary | ICD-10-CM | POA: Diagnosis not present

## 2017-07-31 DIAGNOSIS — N200 Calculus of kidney: Secondary | ICD-10-CM | POA: Diagnosis not present

## 2017-07-31 DIAGNOSIS — N3020 Other chronic cystitis without hematuria: Secondary | ICD-10-CM | POA: Diagnosis not present

## 2017-08-07 DIAGNOSIS — R3915 Urgency of urination: Secondary | ICD-10-CM | POA: Diagnosis not present

## 2017-08-07 DIAGNOSIS — N281 Cyst of kidney, acquired: Secondary | ICD-10-CM | POA: Diagnosis not present

## 2017-08-07 DIAGNOSIS — N3020 Other chronic cystitis without hematuria: Secondary | ICD-10-CM | POA: Diagnosis not present

## 2017-08-13 ENCOUNTER — Ambulatory Visit (HOSPITAL_BASED_OUTPATIENT_CLINIC_OR_DEPARTMENT_OTHER)
Admission: RE | Admit: 2017-08-13 | Discharge: 2017-08-13 | Disposition: A | Discharge status: Home / Self Care | Payer: Medicare HMO | Source: Ambulatory Visit | Attending: Osteopathic Medicine | Admitting: Osteopathic Medicine

## 2017-08-13 DIAGNOSIS — I340 Nonrheumatic mitral (valve) insufficiency: Secondary | ICD-10-CM | POA: Insufficient documentation

## 2017-08-13 DIAGNOSIS — R002 Palpitations: Secondary | ICD-10-CM | POA: Diagnosis not present

## 2017-08-13 NOTE — Progress Notes (Signed)
Echocardiogram 2D Echocardiogram has been performed.  Jamie Stephens 08/13/2017, 4:01 PM

## 2017-08-15 ENCOUNTER — Ambulatory Visit: Payer: Medicare HMO | Admitting: Osteopathic Medicine

## 2017-08-15 DIAGNOSIS — Z0189 Encounter for other specified special examinations: Secondary | ICD-10-CM

## 2017-08-16 ENCOUNTER — Encounter: Payer: Self-pay | Admitting: Osteopathic Medicine

## 2017-08-16 ENCOUNTER — Ambulatory Visit: Payer: Medicare HMO | Admitting: Osteopathic Medicine

## 2017-08-16 VITALS — BP 108/55 | HR 67 | Wt 127.1 lb

## 2017-08-16 DIAGNOSIS — R002 Palpitations: Secondary | ICD-10-CM | POA: Diagnosis not present

## 2017-08-16 NOTE — Progress Notes (Signed)
HPI: Jamie Stephens is a 71 y.o. female who  has no past medical history on file.  she presents to [facility] today, 08/16/17,  for chief complaint of:  Chief Complaint  Patient presents with  . Palpitations     . Context: Reports hx mitral valve issue found on full body scan at some point in the past, recent echo shows normal mitral valve. Seen for Medicare Visit 07/04/17, c/o palpitations, labs and echo ordered - no concerns  . Location/Quality: chest fluttering  . Severity: getting worse recently  . Duration: 3+ months seems more frequent but ongoing a few years altogehter . Timing: usually at night, basically every night or evry time she lies down,  . Modifying factors:sitting up a bit seems to help  EKG last year sinus, bradycardic.     Past medical, surgical, social and family history reviewed:  Patient Active Problem List   Diagnosis Date Noted  . Abnormal urine finding 07/19/2017  . Cystitis 02/08/2017  . Palpitations 06/24/2016  . Non-rheumatic mitral regurgitation 06/24/2016  . Need for diphtheria-tetanus-pertussis (Tdap) vaccine, adult/adolescent 06/24/2016  . Osteoporosis without current pathological fracture 06/24/2016  . Dry eye 06/24/2016  . Osteopenia 06/22/2016    Past Surgical History:  Procedure Laterality Date  . ABDOMINAL HYSTERECTOMY  2014   PARTIAL W/BLADDER TACK  . OVARIAN CYST REMOVAL  2004    Social History   Tobacco Use  . Smoking status: Never Smoker  . Smokeless tobacco: Never Used  Substance Use Topics  . Alcohol use: Not on file    Family History  Problem Relation Age of Onset  . Cancer Mother        COLON     Current medication list and allergy/intolerance information reviewed:    Current Outpatient Medications  Medication Sig Dispense Refill  . alendronate (FOSAMAX) 35 MG tablet Take 1 tablet (35 mg total) by mouth every 7 (seven) days. 30 tablet 1  . ciclopirox (PENLAC) 8 % solution  Apply topically at bedtime. Apply over nail and surrounding skin. Apply daily over previous coat. After seven (7) days, may remove with alcohol and continue cycle. (Patient not taking: Reported on 08/16/2017) 6.6 mL 0  . mirabegron ER (MYRBETRIQ) 25 MG TB24 tablet Take 1 tablet (25 mg total) by mouth daily. (Patient not taking: Reported on 08/16/2017) 90 tablet 3   No current facility-administered medications for this visit.     No Known Allergies    Review of Systems:  Constitutional:  No  fever, no chills, No recent illness,  HEENT: No  headache, no vision change  Cardiac: No  chest pain, No  pressure, +palpitations, No  Orthopnea  Respiratory:  No  shortness of breath. No  Cough  Gastrointestinal: No  abdominal pain, No  nausea,  Neurologic: No  weakness, No  dizziness  Psychiatric: No  concerns with depression, No  concerns with anxiety  Exam:  BP (!) 108/55   Pulse 67   Wt 127 lb 1.3 oz (57.6 kg)   BMI 23.24 kg/m   Constitutional: VS see above. General Appearance: alert, well-developed, well-nourished, NAD  Eyes: Normal lids and conjunctive, non-icteric sclera  Ears, Nose, Mouth, Throat: MMM, Normal external inspection ears/nares/mouth/lips/gums.  Neck: No masses, trachea midline. No thyroid enlargement.   Respiratory: Normal respiratory effort. no wheeze, no rhonchi, no rales  Cardiovascular: S1/S2 normal, no murmur, no rub/gallop auscultated. RRR. No lower extremity edema.   Neurological: Normal balance/coordination. No tremor.   Skin: warm,  dry, intact.   Psychiatric: Normal judgment/insight. Normal mood and affect. Oriented x3.    Recent Results (from the past 2160 hour(s))  CBC     Status: None   Collection Time: 07/04/17 10:44 AM  Result Value Ref Range   WBC 5.0 3.8 - 10.8 Thousand/uL   RBC 3.89 3.80 - 5.10 Million/uL   Hemoglobin 12.0 11.7 - 15.5 g/dL   HCT 35.5 35.0 - 45.0 %   MCV 91.3 80.0 - 100.0 fL   MCH 30.8 27.0 - 33.0 pg   MCHC 33.8  32.0 - 36.0 g/dL   RDW 11.8 11.0 - 15.0 %   Platelets 239 140 - 400 Thousand/uL   MPV 9.4 7.5 - 12.5 fL  COMPLETE METABOLIC PANEL WITH GFR     Status: None   Collection Time: 07/04/17 10:44 AM  Result Value Ref Range   Glucose, Bld 85 65 - 99 mg/dL    Comment: .            Fasting reference interval .    BUN 17 7 - 25 mg/dL   Creat 0.78 0.60 - 0.93 mg/dL    Comment: For patients >26 years of age, the reference limit for Creatinine is approximately 13% higher for people identified as African-American. .    GFR, Est Non African American 76 > OR = 60 mL/min/1.56m2   GFR, Est African American 89 > OR = 60 mL/min/1.70m2   BUN/Creatinine Ratio NOT APPLICABLE 6 - 22 (calc)   Sodium 142 135 - 146 mmol/L   Potassium 4.1 3.5 - 5.3 mmol/L   Chloride 108 98 - 110 mmol/L   CO2 26 20 - 32 mmol/L   Calcium 8.9 8.6 - 10.4 mg/dL   Total Protein 6.4 6.1 - 8.1 g/dL   Albumin 4.1 3.6 - 5.1 g/dL   Globulin 2.3 1.9 - 3.7 g/dL (calc)   AG Ratio 1.8 1.0 - 2.5 (calc)   Total Bilirubin 0.7 0.2 - 1.2 mg/dL   Alkaline phosphatase (APISO) 54 33 - 130 U/L   AST 18 10 - 35 U/L   ALT 14 6 - 29 U/L  Lipid panel     Status: Abnormal   Collection Time: 07/04/17 10:44 AM  Result Value Ref Range   Cholesterol 195 <200 mg/dL   HDL 74 >50 mg/dL   Triglycerides 105 <150 mg/dL   LDL Cholesterol (Calc) 101 (H) mg/dL (calc)    Comment: Reference range: <100 . Desirable range <100 mg/dL for primary prevention;   <70 mg/dL for patients with CHD or diabetic patients  with > or = 2 CHD risk factors. Marland Kitchen LDL-C is now calculated using the Martin-Hopkins  calculation, which is a validated novel method providing  better accuracy than the Friedewald equation in the  estimation of LDL-C.  Cresenciano Genre et al. Annamaria Helling. 9381;017(51): 2061-2068  (http://education.[facility].com/faq/FAQ164)    Total CHOL/HDL Ratio 2.6 <5.0 (calc)   Non-HDL Cholesterol (Calc) 121 <130 mg/dL (calc)    Comment: For patients with diabetes  plus 1 major ASCVD risk  factor, treating to a non-HDL-C goal of <100 mg/dL  (LDL-C of <70 mg/dL) is considered a therapeutic  option.   VITAMIN D 25 Hydroxy (Vit-D Deficiency, Fractures)     Status: None   Collection Time: 07/04/17 10:44 AM  Result Value Ref Range   Vit D, 25-Hydroxy 61 30 - 100 ng/mL    Comment: Vitamin D Status         25-OH Vitamin D: . Deficiency:                    <  20 ng/mL Insufficiency:             20 - 29 ng/mL Optimal:                 > or = 30 ng/mL . For 25-OH Vitamin D testing on patients on  D2-supplementation and patients for whom quantitation  of D2 and D3 fractions is required, the QuestAssureD(TM) 25-OH VIT D, (D2,D3), LC/MS/MS is recommended: order  code [phone] (patients >56yrs). . For more information on this test, go to: http://education.[facility].com/faq/FAQ163 (This link is being provided for  informational/educational purposes only.)   TSH     Status: None   Collection Time: 07/04/17 10:44 AM  Result Value Ref Range   TSH 2.81 0.40 - 4.50 mIU/L  Urinalysis, Routine w reflex microscopic     Status: Abnormal   Collection Time: 07/04/17 10:46 AM  Result Value Ref Range   Color, Urine YELLOW YELLOW   APPearance CLEAR CLEAR   Specific Gravity, Urine 1.023 1.001 - 1.03   pH 6.5 5.0 - 8.0   Glucose, UA NEGATIVE NEGATIVE   Bilirubin Urine NEGATIVE NEGATIVE   Ketones, ur NEGATIVE NEGATIVE   Hgb urine dipstick NEGATIVE NEGATIVE   Protein, ur NEGATIVE NEGATIVE   Nitrite POSITIVE (A) NEGATIVE   Leukocytes, UA 1+ (A) NEGATIVE   WBC, UA 0-5 0 - 5 /HPF   RBC / HPF NONE SEEN 0 - 2 /HPF   Squamous Epithelial / LPF NONE SEEN < OR = 5 /HPF   Bacteria, UA FEW (A) NONE SEEN /HPF   Hyaline Cast NONE SEEN NONE SEEN /LPF  Urine Culture     Status: Abnormal   Collection Time: 07/04/17 10:47 AM  Result Value Ref Range   MICRO NUMBER: [id number]    SPECIMEN QUALITY: ADEQUATE    Sample Source URINE    STATUS: FINAL    ISOLATE 1: Achrombacter  xylosoxidans/denitrificans (A)       Susceptibility   Achrombacter xylosoxidans/denitrificans - URINE CULTURE, REFLEX    AMIKACIN >32 Resistant     CEFTAZIDIME 4 Sensitive     CEFEPIME <=8 Sensitive     CIPROFLOXACIN <=1 Sensitive     LEVOFLOXACIN <=2 Sensitive     GENTAMICIN >8 Resistant     IMIPENEM <=4 Sensitive     MEROPENEM <=4 Sensitive     PIP/TAZO <=16 Sensitive     TOBRAMYCIN* >8 Resistant      * Legend:S = Susceptible  I = IntermediateR = Resistant  NS = Not susceptible* = Not tested  NR = Not reported**NN = See antimicrobic comments   EKG interpretation: Rate: 67 Rhythm: sinus No ST/T changes concerning for acute ischemia/infarct  Previous EKG bradycardia sinus    ASSESSMENT/PLAN:   Palpitations - BP low enough I don't think I'd start a beta blocker right now. Will see what cardio says.  - Plan: Ambulatory referral to Cardiology, Holter monitor - 48 hour    Patient Instructions  Plan:  Let's get a 48 hour heart monitor on and see what that shows if it can catch an abnormal rhythm as your symptoms occur.   Referral placed for cardiology   If any severe palpitations causing dizziness, loss of consciousness, headache, or other concerns, or if chest pain or shortness of breath - seek emergency care!     Visit summary with medication list and pertinent instructions was printed for patient to review. All questions at time of visit were answered - patient instructed to contact office with any  additional concerns. ER/RTC precautions were reviewed with the patient.   Follow-up plan: Return for recheck as needed if worse, otherwise will see what cardiology says! .  Note: Total time spent 25 minutes, greater than 50% of the visit was spent face-to-face counseling and coordinating care for the following: The encounter diagnosis was Palpitations..  Please note: voice recognition software was used to produce this document, and typos may escape review. Please contact Dr.  Sheppard Coil for any needed clarifications.

## 2017-08-16 NOTE — Addendum Note (Signed)
Addended by: Mertha Finders on: 08/16/2017 10:51 AM   Modules accepted: Orders

## 2017-08-16 NOTE — Patient Instructions (Addendum)
Plan:  Let's get a 48 hour heart monitor on and see what that shows if it can catch an abnormal rhythm as your symptoms occur.   Referral placed for cardiology   If any severe palpitations causing dizziness, loss of consciousness, headache, or other concerns, or if chest pain or shortness of breath - seek emergency care!

## 2017-08-26 ENCOUNTER — Other Ambulatory Visit: Payer: Self-pay | Admitting: Osteopathic Medicine

## 2017-08-26 DIAGNOSIS — Z1231 Encounter for screening mammogram for malignant neoplasm of breast: Secondary | ICD-10-CM

## 2017-08-28 ENCOUNTER — Ambulatory Visit (INDEPENDENT_AMBULATORY_CARE_PROVIDER_SITE_OTHER): Payer: Medicare HMO

## 2017-08-28 DIAGNOSIS — Z1231 Encounter for screening mammogram for malignant neoplasm of breast: Secondary | ICD-10-CM | POA: Diagnosis not present

## 2017-08-29 ENCOUNTER — Ambulatory Visit (INDEPENDENT_AMBULATORY_CARE_PROVIDER_SITE_OTHER): Payer: Medicare HMO

## 2017-08-29 DIAGNOSIS — R002 Palpitations: Secondary | ICD-10-CM | POA: Diagnosis not present

## 2017-09-04 DIAGNOSIS — R399 Unspecified symptoms and signs involving the genitourinary system: Secondary | ICD-10-CM | POA: Insufficient documentation

## 2017-09-04 DIAGNOSIS — R300 Dysuria: Secondary | ICD-10-CM | POA: Insufficient documentation

## 2017-09-04 DIAGNOSIS — N952 Postmenopausal atrophic vaginitis: Secondary | ICD-10-CM | POA: Insufficient documentation

## 2017-09-04 HISTORY — DX: Postmenopausal atrophic vaginitis: N95.2

## 2017-09-05 NOTE — Progress Notes (Signed)
Cardiology Office Note:    Date:  09/06/2017   ID:  Jamie Stephens, DOB [birth year], MRN [id number]  PCP:  Emeterio Reeve, DO  Cardiologist:  Shirlee More, MD   Referring MD: Emeterio Reeve, DO  ASSESSMENT:    1. Palpitations    PLAN:    In order of problems listed above:  1. Her symptoms are consistent with atrial tachycardia, symptoms have become frequent daily life disruptive and will place her on a low-dose of a selective beta-blocker ACE butyl all for the next month and then make a decision whether she could be retreated as needed.  She has had a good evaluation including echocardiogram for all purposes normal Holter monitor and laboratory studies including CBC thyroid and BMP all of which are normal.  She will avoid over-the-counter proarrhythmic drugs.  Next appointment in 4 weeks   Medication Adjustments/Labs and Tests Ordered: Current medicines are reviewed at length with the patient today.  Concerns regarding medicines are outlined above.  No orders of the defined types were placed in this encounter.  No orders of the defined types were placed in this encounter.    Chief Complaint  Patient presents with  . Palpitations  . Shortness of Breath    History of Present Illness:    Jamie Stephens is a 72 y.o. female who is being seen today for the evaluation of palpitation at the request of Emeterio Reeve, DO.  She has a history of palpitations dating back 6 years ago with the death of her father.  At that time particularly at rest in the evening should be aware of her heart beating irregularly.  The symptoms tend to wax and wane and markedly worsened in the last year after the death of her husband.  She has episodes almost every evening last up to 30 minutes she is aware of her heart not particularly irregular or rapid and the symptoms happen at rest when she is supine going to bed.  This made her apprehensive that she is having atrial fibrillation is at risk for  stroke.  At times she takes decongestant not frequently. In 2006 she was screening duplex of her heart and was told she had mitral valve abnormality.  Her valve is structurally normal and a recent echocardiogram and she has physiologic degree of valvular regurgitation due to closing volume.  She has no history of congenital or rheumatic heart disease. Her symptoms do not occur with physical effort she has no exercise intolerance chest pain shortness of breath syncope or TIA. Past Medical History:  Diagnosis Date  . 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 Anxiety disorder 03/02/2013  . 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 Changing skin lesion 02/05/2017  . Cystitis 02/08/2017  . Dry eye 06/24/2016  . Dysuria 09/04/2017  . Encounter for counseling 07/27/2016  . Family history of colon cancer 03/02/2013  . GERD (gastroesophageal reflux disease) 02/28/2011  . Kidney stone 09/01/2013  . Need for diphtheria-tetanus-pertussis (Tdap) vaccine, adult/adolescent 06/24/2016  . Non-rheumatic mitral regurgitation 06/24/2016  . Osteopenia 06/22/2016  . Osteoporosis without current pathological fracture 06/24/2016  . Palpitations 06/24/2016   normal EKG, infrequent "heart flutters," normal previous exch except mild MR an dno murmur today, labs pending, pt will consider Holter/repeat Echo  . Symptoms involving urinary system 09/04/2017  . Urinary tract infection, site not specified 09/01/2013   Overview:  10/1 IMO update    Past Surgical History:  Procedure Laterality Date  .  ABDOMINAL HYSTERECTOMY  2014   PARTIAL W/BLADDER TACK  . BREAST CYST ASPIRATION Bilateral   . OVARIAN CYST REMOVAL  2004    Current Medications: No outpatient medications have been marked as taking for the 09/06/17 encounter (Office Visit) with Richardo Priest, MD.     Allergies:   Patient has no known allergies.   Social History   Socioeconomic History  .  Marital status: Widowed    Spouse name: None  . Number of children: None  . Years of education: None  . Highest education level: None  Social Needs  . Financial resource strain: None  . Food insecurity - worry: None  . Food insecurity - inability: None  . Transportation needs - medical: None  . Transportation needs - non-medical: None  Occupational History  . None  Tobacco Use  . Smoking status: Never Smoker  . Smokeless tobacco: Never Used  Substance and Sexual Activity  . Alcohol use: None  . Drug use: None  . Sexual activity: None  Other Topics Concern  . None  Social History Narrative  . None     Family History: The patient's family history includes Cancer in her mother.  ROS:   Review of Systems  Constitution: Negative.  HENT: Negative.   Eyes: Negative.   Cardiovascular: Positive for palpitations.  Respiratory: Positive for shortness of breath (mild exertional unchanged).   Endocrine: Negative.   Hematologic/Lymphatic: Negative.   Skin: Negative.   Musculoskeletal: Negative.   Gastrointestinal: Negative.   Genitourinary: Negative.   Neurological: Negative.   Psychiatric/Behavioral: Negative.   Allergic/Immunologic: Negative.    Please see the history of present illness.     All other systems reviewed and are negative.  EKGs/Labs/Other Studies Reviewed:    The following studies were reviewed today:  EKG 08/16/17 [place] normal EKG  TTE: - Left ventricle: The cavity size was normal. Systolic function was   normal. The estimated ejection fraction was in the range of 55%   to 65%. Wall motion was normal; there were no regional wall   motion abnormalities. Left ventricular diastolic function   parameters were normal. - Aortic valve: Valve area (VTI): 3.36 cm^2. Valve area (Vmax):   3.43 cm^2. Valve area (Vmean): 3.21 cm^2. Impressions: - Normal LVEF.   Trace TR.   Normal echo.  Holter:Conclusion: Occasional supraventricular ectopic with 3 brief runs of  atrial tachycardia longest 10 complexes  Recent Labs: 07/04/2017: ALT 14; BUN 17; Creat 0.78; Hemoglobin 12.0; Platelets 239; Potassium 4.1; Sodium 142; TSH 2.81  Recent Lipid Panel    Component Value Date/Time   CHOL 195 07/04/2017 1044   TRIG 105 07/04/2017 1044   HDL 74 07/04/2017 1044   CHOLHDL 2.6 07/04/2017 1044   VLDL 15 06/25/2016 0926   LDLCALC 117 06/25/2016 0926    Physical Exam:    VS:  Ht 5\' 2"  (1.575 m)   Wt 128 lb (58.1 kg)   BMI 23.41 kg/m     Wt Readings from Last 3 Encounters:  09/06/17 128 lb (58.1 kg)  08/16/17 127 lb 1.3 oz (57.6 kg)  07/04/17 125 lb (56.7 kg)     GEN:  Well nourished, well developed in no acute distress HEENT: Normal NECK: No JVD; No carotid bruits LYMPHATICS: No lymphadenopathy CARDIAC: She has no murmur or click RRR, no murmurs, rubs, gallops RESPIRATORY:  Clear to auscultation without rales, wheezing or rhonchi  ABDOMEN: Soft, non-tender, non-distended MUSCULOSKELETAL:  No edema; No deformity  SKIN: Warm and dry NEUROLOGIC:  Alert and oriented x 3 PSYCHIATRIC:  Normal affect     Signed, Shirlee More, MD  09/06/2017 10:45 AM    [place]

## 2017-09-06 ENCOUNTER — Ambulatory Visit (INDEPENDENT_AMBULATORY_CARE_PROVIDER_SITE_OTHER): Payer: Medicare HMO | Admitting: Cardiology

## 2017-09-06 ENCOUNTER — Encounter: Payer: Self-pay | Admitting: Cardiology

## 2017-09-06 VITALS — BP 110/62 | HR 74 | Ht 62.0 in | Wt 128.0 lb

## 2017-09-06 DIAGNOSIS — R002 Palpitations: Secondary | ICD-10-CM | POA: Diagnosis not present

## 2017-09-06 MED ORDER — ACEBUTOLOL HCL 200 MG PO CAPS: 200.0000 mg | ORAL_CAPSULE | Freq: Two times a day (BID) | ORAL | 3 refills | Status: DC

## 2017-09-06 NOTE — Patient Instructions (Addendum)
Medication Instructions:  Your physician has recommended you make the following change in your medication:  START acebutolol 200 mg daily  Labwork: None  Testing/Procedures: None  Follow-Up: Your physician recommends that you schedule a follow-up appointment in: 1 month  Any Other Special Instructions Will Be Listed Below (If Applicable).  1. Avoid all over-the-counter antihistamines except Claritin/Loratadine and Zyrtec/Cetrizine. 2. Avoid all combination including cold sinus allergies flu decongestant and sleep medications 3. You can use Robitussin DM Mucinex and Mucinex DM for cough. 4. can use Tylenol aspirin ibuprofen and naproxen but no combinations such as sleep or sinus.   If you need a refill on your cardiac medications before your next appointment, please call your pharmacy.   Sharon, RN, BSN  Acebutolol capsules What is this medicine? ACEBUTOLOL (a se BYOO toe lole) is a beta-blocker. Beta-blockers reduce the workload on the heart and help it to beat more regularly. This medicine is used to treat high blood pressure and to treat or prevent certain heart rhythm problems. This medicine may be used for other purposes; ask your health care provider or pharmacist if you have questions. COMMON BRAND NAME(S): Sectral What should I tell my health care provider before I take this medicine? They need to know if you have any of these conditions: -diabetes -heart or vessel disease like slow heartrate, worsening heart failure, heart block, sick sinus syndrome or Raynaud's disease -kidney disease -liver disease -lung or breathing disease, like asthma or emphysema -pheochromocytoma -thyroid disease -an unusual or allergic reaction to acebutolol, other beta-blockers, medicines, foods, dyes, or preservatives -pregnant or trying to get pregnant -breast-feeding How should I use this medicine? Take this medicine by mouth with a glass of water. Follow the  directions on the prescription label. You can take this medicine with or without food. Take your doses at regular intervals. Do not take your medicine more often than directed. Do not stop taking this medicine suddenly. This could lead to serious heart-related effects. Talk to your pediatrician regarding the use of this medicine in children. Special care may be needed. Overdosage: If you think you have taken too much of this medicine contact a poison control center or emergency room at once. NOTE: This medicine is only for you. Do not share this medicine with others. What if I miss a dose? If you miss a dose, take it as soon as you can. If it is almost time for your next dose, take only that dose. Do not take double or extra doses. What may interact with this medicine? This medicine may interact with the following medications: -certain medicines for blood pressure, heart disease, irregular heart beat -NSAIDS, medicines for pain and inflammation, like ibuprofen or naproxen This list may not describe all possible interactions. Give your health care provider a list of all the medicines, herbs, non-prescription drugs, or dietary supplements you use. Also tell them if you smoke, drink alcohol, or use illegal drugs. Some items may interact with your medicine. What should I watch for while using this medicine? Visit your doctor or health care professional for regular checks on your progress. Check your heart rate and blood pressure regularly while you are taking this medicine. Ask your doctor or health care professional what your heart rate and blood pressure should be, and when you should contact him or her. You may get drowsy or dizzy. Do not drive, use machinery, or do anything that needs mental alertness until you know how this drug affects  you. Do not stand or sit up quickly, especially if you are an older patient. This reduces the risk of dizzy or fainting spells. Alcohol can make you more drowsy and  dizzy. Avoid alcoholic drinks. This medicine can affect blood sugar levels. If you have diabetes, check with your doctor or health care professional before you change your diet or the dose of your diabetic medicine. Do not treat yourself for coughs, colds, or pain while you are taking this medicine without asking your doctor or health care professional for advice. Some ingredients may increase your blood pressure. What side effects may I notice from receiving this medicine? Side effects that you should report to your doctor or health care professional as soon as possible: -allergic reactions like skin rash, itching or hives, swelling of the face, lips, or tongue -breathing problems -chest pain -cold, tingling, or numb hands or feet -confusion -irregular heartbeat -muscle aches and pains -slow heart rate -sweating -swollen legs or ankles -tremor, shakes -vomiting Side effects that usually do not require medical attention (report to your doctor or health care professional if they continue or are bothersome): -anxiety -change in sex drive or performance -depression -diarrhea -dry or burning eyes -headache -nausea This list may not describe all possible side effects. Call your doctor for medical advice about side effects. You may report side effects to FDA at 1-800-FDA-1088. Where should I keep my medicine? Keep out of the reach of children. Store at room temperature between 15 and 30 degrees C (59 and 86 degrees F). Protect from light. Keep container tightly closed. Throw away any unused medicine after the expiration date. NOTE: This sheet is a summary. It may not cover all possible information. If you have questions about this medicine, talk to your doctor, pharmacist, or health care provider.  2018 Elsevier/Gold Standard (2013-04-26 14:20:28)

## 2017-10-04 ENCOUNTER — Ambulatory Visit: Payer: Medicare HMO | Admitting: Cardiology

## 2017-11-26 ENCOUNTER — Telehealth: Payer: Self-pay | Admitting: Osteopathic Medicine

## 2017-11-26 NOTE — Telephone Encounter (Signed)
Left message for a return call for an appointment.

## 2017-11-26 NOTE — Telephone Encounter (Signed)
Patient called with concerns about shooting pains in her right breast. She says that she had a mammogram in December. However she has been experiencing these pains for sometime. She was wondering if she might need an ultrasound. She asked if you could give her a call.

## 2017-11-26 NOTE — Telephone Encounter (Signed)
Couldn't say for sure without exam and likely any imaging would not be covered anyway without an exam/office visit - she should schedule an appointment.

## 2017-11-27 ENCOUNTER — Telehealth: Payer: Self-pay

## 2017-11-27 NOTE — Telephone Encounter (Signed)
Pt called stating that for some time she has been experiencing tender shooting pain in Right breast. Pt has hx of having cyst due to menopause. Requesting a referral for U/S, since mammogram was normal. Pt was instructed to make an appt w/pcp for evaluation. Pt agree with recommedation & was transferred to [facility] for scheduling.

## 2017-11-28 ENCOUNTER — Ambulatory Visit: Payer: Medicare HMO | Admitting: Physician Assistant

## 2017-12-02 ENCOUNTER — Ambulatory Visit: Payer: Medicare HMO | Admitting: Physician Assistant

## 2017-12-02 ENCOUNTER — Ambulatory Visit (INDEPENDENT_AMBULATORY_CARE_PROVIDER_SITE_OTHER): Payer: Medicare HMO | Admitting: Family Medicine

## 2017-12-02 ENCOUNTER — Encounter: Payer: Self-pay | Admitting: Family Medicine

## 2017-12-02 VITALS — BP 113/61 | HR 60 | Ht 62.01 in | Wt 128.0 lb

## 2017-12-02 DIAGNOSIS — N644 Mastodynia: Secondary | ICD-10-CM | POA: Diagnosis not present

## 2017-12-02 NOTE — Progress Notes (Signed)
Subjective:    Patient ID: Jamie Stephens, female    DOB: 1946-02-13, 72 y.o.   MRN: [id number]  HPI  72 year old female comes in today with concerns regarding her right breast.  Her last mammogram was August 28, 2017 and it was normal.  She describes the pain as a shooting pain mostly on the lateral outer side of the breast.  She says she actually was feeling it before she had her mammogram in December but thought it would catch something if something was truly abnormal.  She has had a cyst biopsied in 1 of her breast but does not remember which side but she is never had breast cancer.  She says the pain comes and goes.  She has not changed bra wear recently.  She denies any injury or trauma.  She has a little bit of caffeine in the mornings but no recent increase in caffeine intake.  Plus she is postmenopausal.  Review of Systems   BP 113/61   Pulse 60   Ht 5' 2.01" (1.575 m)   Wt 128 lb (58.1 kg)   SpO2 100%   BMI 23.41 kg/m     No Known Allergies  Past Medical History:  Diagnosis Date  . 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 Anxiety disorder 03/02/2013  . 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 Changing skin lesion 02/05/2017  . Cystitis 02/08/2017  . Dry eye 06/24/2016  . Dysuria 09/04/2017  . Encounter for counseling 07/27/2016  . Family history of colon cancer 03/02/2013  . GERD (gastroesophageal reflux disease) 02/28/2011  . Kidney stone 09/01/2013  . Need for diphtheria-tetanus-pertussis (Tdap) vaccine, adult/adolescent 06/24/2016  . Non-rheumatic mitral regurgitation 06/24/2016  . Osteopenia 06/22/2016  . Osteoporosis without current pathological fracture 06/24/2016  . Palpitations 06/24/2016   normal EKG, infrequent "heart flutters," normal previous exch except mild MR an dno murmur today, labs pending, pt will consider Holter/repeat Echo  . Symptoms involving urinary system 09/04/2017  . Urinary tract  infection, site not specified 09/01/2013   Overview:  10/1 IMO update    Past Surgical History:  Procedure Laterality Date  . ABDOMINAL HYSTERECTOMY  2014   PARTIAL W/BLADDER TACK  . BREAST CYST ASPIRATION Bilateral   . OVARIAN CYST REMOVAL  2004    Social History   Socioeconomic History  . Marital status: Widowed    Spouse name: Not on file  . Number of children: Not on file  . Years of education: Not on file  . Highest education level: Not on file  Occupational History  . Not on file  Social Needs  . Financial resource strain: Not on file  . Food insecurity:    Worry: Not on file    Inability: Not on file  . Transportation needs:    Medical: Not on file    Non-medical: Not on file  Tobacco Use  . Smoking status: Never Smoker  . Smokeless tobacco: Never Used  Substance and Sexual Activity  . Alcohol use: Not on file  . Drug use: Not on file  . Sexual activity: Not on file  Lifestyle  . Physical activity:    Days per week: Not on file    Minutes per session: Not on file  . Stress: Not on file  Relationships  . Social connections:    Talks on phone: Not on file    Gets together: Not on file    Attends religious service: Not on  file    Active member of club or organization: Not on file    Attends meetings of clubs or organizations: Not on file    Relationship status: Not on file  . Intimate partner violence:    Fear of current or ex partner: Not on file    Emotionally abused: Not on file    Physically abused: Not on file    Forced sexual activity: Not on file  Other Topics Concern  . Not on file  Social History Narrative  . Not on file    Family History  Problem Relation Age of Onset  . Cancer Mother        COLON    Outpatient Encounter Medications as of 12/02/2017  Medication Sig  . alendronate (FOSAMAX) 35 MG tablet Take 1 tablet (35 mg total) by mouth every 7 (seven) days.  . Multiple Vitamins-Minerals (MULTIVITAMIN WITH MINERALS) tablet Take 1  tablet by mouth daily.  . [DISCONTINUED] acebutolol (SECTRAL) 200 MG capsule Take 1 capsule (200 mg total) by mouth 2 (two) times daily. Take at 6 Pm daily  . [DISCONTINUED] DOCOSAHEXAENOIC ACID PO Take 1,000 mg by mouth daily.    No facility-administered encounter medications on file as of 12/02/2017.           Objective:   Physical Exam  Constitutional: She is oriented to person, place, and time. She appears well-developed and well-nourished.  HENT:  Head: Normocephalic and atraumatic.  Eyes: Conjunctivae and EOM are normal.  Cardiovascular: Normal rate.  Pulmonary/Chest: Effort normal. Left breast exhibits no inverted nipple, no mass, no nipple discharge, no skin change and no tenderness. Breasts are asymmetrical.    Also when she lays on her back she has some dimpling of the right nipple compared to her left.  No palpable masses or nodules.  No erythema.  No discharge from the nipple.  Neurological: She is alert and oriented to person, place, and time.  Skin: Skin is dry. No pallor.  Psychiatric: She has a normal mood and affect. Her behavior is normal.  Vitals reviewed.       Assessment & Plan:    Right breast pain-we will refer to the breast center for further evaluation for ultrasound and diagnostic mammogram.  No prior history of breast cancer.  We will follow-up as needed.  Call with results once available.

## 2017-12-04 ENCOUNTER — Other Ambulatory Visit: Payer: Medicare HMO

## 2017-12-06 ENCOUNTER — Ambulatory Visit: Payer: Medicare HMO

## 2017-12-06 ENCOUNTER — Ambulatory Visit
Admission: RE | Admit: 2017-12-06 | Discharge: 2017-12-06 | Disposition: A | Discharge status: Home / Self Care | Payer: Medicare HMO | Source: Ambulatory Visit | Attending: Family Medicine | Admitting: Family Medicine

## 2017-12-06 DIAGNOSIS — R928 Other abnormal and inconclusive findings on diagnostic imaging of breast: Secondary | ICD-10-CM | POA: Diagnosis not present

## 2017-12-06 DIAGNOSIS — N644 Mastodynia: Secondary | ICD-10-CM

## 2017-12-24 ENCOUNTER — Ambulatory Visit (INDEPENDENT_AMBULATORY_CARE_PROVIDER_SITE_OTHER): Payer: Medicare HMO | Admitting: Osteopathic Medicine

## 2017-12-24 ENCOUNTER — Encounter: Payer: Self-pay | Admitting: Osteopathic Medicine

## 2017-12-24 VITALS — BP 119/64 | HR 58 | Temp 98.0°F | Wt 128.4 lb

## 2017-12-24 DIAGNOSIS — R1013 Epigastric pain: Secondary | ICD-10-CM

## 2017-12-24 DIAGNOSIS — R1084 Generalized abdominal pain: Secondary | ICD-10-CM | POA: Diagnosis not present

## 2017-12-24 LAB — CBC WITH DIFFERENTIAL/PLATELET
Basophils Absolute: 49 cells/uL (ref 0–200)
Basophils Relative: 0.9 %
EOS PCT: 2 %
Eosinophils Absolute: 108 cells/uL (ref 15–500)
HEMATOCRIT: 37.3 % (ref 35.0–45.0)
Hemoglobin: 12.8 g/dL (ref 11.7–15.5)
LYMPHS ABS: 1696 {cells}/uL (ref 850–3900)
MCH: 30.9 pg (ref 27.0–33.0)
MCHC: 34.3 g/dL (ref 32.0–36.0)
MCV: 90.1 fL (ref 80.0–100.0)
MONOS PCT: 11.3 %
MPV: 9.6 fL (ref 7.5–12.5)
NEUTROS PCT: 54.4 %
Neutro Abs: 2938 cells/uL (ref 1500–7800)
PLATELETS: 271 10*3/uL (ref 140–400)
RBC: 4.14 10*6/uL (ref 3.80–5.10)
RDW: 12.3 % (ref 11.0–15.0)
TOTAL LYMPHOCYTE: 31.4 %
WBC mixed population: 610 cells/uL (ref 200–950)
WBC: 5.4 10*3/uL (ref 3.8–10.8)

## 2017-12-24 MED ORDER — OMEPRAZOLE 40 MG PO CPDR: 40.0000 mg | DELAYED_RELEASE_CAPSULE | Freq: Every day | ORAL | 1 refills | Status: DC

## 2017-12-24 NOTE — Progress Notes (Signed)
HPI: Jamie Stephens is a 72 y.o. female who  has a past medical history of Anxiety disorder (03/02/2013), Atrophic vaginitis (09/04/2017), Dry eye (06/24/2016), Family history of colon cancer (03/02/2013), GERD (gastroesophageal reflux disease) (02/28/2011), Kidney stone (09/01/2013), Non-rheumatic mitral regurgitation (06/24/2016), Osteopenia (06/22/2016), Osteoporosis without current pathological fracture (06/24/2016), and Palpitations (06/24/2016).  she presents to [facility] today, 12/24/17,  for chief complaint of: Abdominal pain  Tenderness in upper abdomen. 4 days ago started, worse about 3 days ago, 2 days ago was the worst and has diarrhea then and yesterday. Fullness, tenderness. Hurts when she moves around a bit - leaning forward or twisting, taking a deep breath. Has been working in her garden, thinks she may have pulled something but no obvious innjury. Mild nusea briefly. Sometimes takes medicines for acid reflux, omeprazole. Last dose was yesterday.   Had EGD few years ago, no concerns per patient, no records available.    Past medical, surgical, social and family history reviewed:  Patient Active Problem List   Diagnosis Date Noted  . Dysuria 09/04/2017  . Symptoms involving urinary system 09/04/2017  . 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 Abnormal urine finding 07/19/2017  . Cystitis 02/08/2017  . Changing skin lesion 02/05/2017  . Encounter for counseling 07/27/2016  . Palpitations 06/24/2016  . Non-rheumatic mitral regurgitation 06/24/2016  . Need for diphtheria-tetanus-pertussis (Tdap) vaccine, adult/adolescent 06/24/2016  . Osteoporosis without current pathological fracture 06/24/2016  . Dry eye 06/24/2016  . Osteopenia 06/22/2016  . Kidney stone 09/01/2013  . Urinary tract infection, site not specified 09/01/2013  . Anxiety disorder 03/02/2013  . Family history of colon cancer 03/02/2013  . GERD (gastroesophageal reflux disease)  02/28/2011    Past Surgical History:  Procedure Laterality Date  . ABDOMINAL HYSTERECTOMY  2014   PARTIAL W/BLADDER TACK  . BREAST CYST ASPIRATION Bilateral   . OVARIAN CYST REMOVAL  2004    Social History   Tobacco Use  . Smoking status: Never Smoker  . Smokeless tobacco: Never Used  Substance Use Topics  . Alcohol use: Not on file    Family History  Problem Relation Age of Onset  . Cancer Mother        COLON  . Breast cancer Neg Hx      Current medication list and allergy/intolerance information reviewed:    Current Outpatient Medications  Medication Sig Dispense Refill  . alendronate (FOSAMAX) 35 MG tablet Take 1 tablet (35 mg total) by mouth every 7 (seven) days. 30 tablet 1  . Multiple Vitamins-Minerals (MULTIVITAMIN WITH MINERALS) tablet Take 1 tablet by mouth daily.     No current facility-administered medications for this visit.     No Known Allergies    Review of Systems:  Constitutional:  No  fever, no chills, No recent illness, No unintentional weight changes. No significant fatigue.   HEENT: No  headache, no vision change, no hearing change  Cardiac: No  chest pain, No  pressure, No palpitations  Respiratory:  No  shortness of breath. No  Cough  Gastrointestinal: +abdominal pain, +nausea, No  vomiting,  No  blood in stool, +diarrhea, No  constipation   Musculoskeletal: No new myalgia/arthralgia  Skin: No  Rash  Hem/Onc: No  easy bruising/bleeding  Neurologic: No  weakness, No  dizziness   Exam:  BP 119/64 (BP Location: Left Arm, Patient Position: Sitting, Cuff Size: Normal)   Pulse (!) 58   Temp 98 F (36.7 C) (Oral)   Wt 128 lb 6.4  oz (58.2 kg)   BMI 23.48 kg/m   Constitutional: VS see above. General Appearance: alert, well-developed, well-nourished, NAD  Eyes: Normal lids and conjunctive, non-icteric sclera  Ears, Nose, Mouth, Throat: MMM, Normal external inspection ears/nares/mouth/lips/gums.   Neck: No masses, trachea  midline. No thyroid enlargement.   Respiratory: Normal respiratory effort. no wheeze, no rhonchi, no rales  Cardiovascular: S1/S2 normal, no murmur, no rub/gallop auscultated. RRR. No lower extremity edema. No abdominal aortic bruit.  Gastrointestinal: (+)TTP epigastric and just to L of epigastrum, no masses. No hepatomegaly, no splenomegaly. No hernia appreciated. Bowel sounds normal. Rectal exam deferred.   Musculoskeletal: Gait normal. No clubbing/cyanosis of digits.   Neurological: Normal balance/coordination. No tremor  Skin: warm, dry, intact. No rash/ulcer.   Psychiatric: Normal judgment/insight. Normal mood and affect. Oriented x3.      ASSESSMENT/PLAN: Ddx: hiatal hernia or gastritis w/ unrelated loose stool, msk pain upper abdominal muscles w/ unrelated loost stool, gastroenteritis viral, pancreatitis.   Epigastric abdominal pain - Plan: COMPLETE METABOLIC PANEL WITH GFR, Lipase, CBC with Differential/Platelet  Generalized abdominal pain - Plan: CT Abdomen Pelvis W Contrast    Patient Instructions  Plan:  Labs and medicine today   CT scan in the next few days unless all better (may resolve if viral or muscle strain)   GI referral if no answers on labs/scan and no improvement, may need scope   Meds ordered this encounter  Medications  . omeprazole (PRILOSEC) 40 MG capsule    Sig: Take 1 capsule (40 mg total) by mouth daily.    Dispense:  30 capsule    Refill:  1       Visit summary with medication list and pertinent instructions was printed for patient to review. All questions at time of visit were answered - patient instructed to contact office with any additional concerns. ER/RTC precautions were reviewed with the patient.   Follow-up plan: Return if symptoms worsen or fail to improve.   Please note: voice recognition software was used to produce this document, and typos may escape review. Please contact Dr. Sheppard Coil for any needed clarifications.

## 2017-12-24 NOTE — Patient Instructions (Addendum)
Plan:  Labs and medicine today   CT scan in the next few days unless all better (may resolve if viral or muscle strain)   GI referral if no answers on labs/scan and no improvement, may need scope

## 2017-12-25 LAB — COMPLETE METABOLIC PANEL WITH GFR
AG Ratio: 1.6 (calc) (ref 1.0–2.5)
ALBUMIN MSPROF: 4.2 g/dL (ref 3.6–5.1)
ALT: 14 U/L (ref 6–29)
AST: 18 U/L (ref 10–35)
Alkaline phosphatase (APISO): 60 U/L (ref 33–130)
BILIRUBIN TOTAL: 0.5 mg/dL (ref 0.2–1.2)
BUN: 14 mg/dL (ref 7–25)
CO2: 28 mmol/L (ref 20–32)
Calcium: 9.4 mg/dL (ref 8.6–10.4)
Chloride: 107 mmol/L (ref 98–110)
Creat: 0.71 mg/dL (ref 0.60–0.93)
GFR, EST AFRICAN AMERICAN: 99 mL/min/{1.73_m2} (ref >=60)
GFR, Est Non African American: 85 mL/min/{1.73_m2} (ref >=60)
GLOBULIN: 2.6 g/dL (ref 1.9–3.7)
GLUCOSE: 92 mg/dL (ref 65–99)
Potassium: 4.4 mmol/L (ref 3.5–5.3)
Sodium: 142 mmol/L (ref 135–146)
TOTAL PROTEIN: 6.8 g/dL (ref 6.1–8.1)

## 2017-12-25 LAB — LIPASE: Lipase: 51 U/L (ref 7–60)

## 2018-01-02 ENCOUNTER — Telehealth: Payer: Self-pay | Admitting: Osteopathic Medicine

## 2018-01-02 NOTE — Telephone Encounter (Signed)
Called patient - abd pain has resolved. Will not pursue imaging PA at this point

## 2018-01-24 ENCOUNTER — Other Ambulatory Visit: Payer: Self-pay | Admitting: Osteopathic Medicine

## 2018-01-24 NOTE — Telephone Encounter (Signed)
[facility] requesting med RF for omeprazole. As per provider's telephone msg on 01/02/18 - "abd pain has resolved". Pls advise if RF is appropriate.

## 2018-01-28 NOTE — Telephone Encounter (Signed)
Pt left a vm msg requesting a call back. Tried contacting pt, no answer, left a second vm with call back information.

## 2018-01-28 NOTE — Telephone Encounter (Signed)
Left vm msg for pt re: med RF & provider's note.

## 2018-01-28 NOTE — Telephone Encounter (Signed)
Would call patient to clarify if she still feels she needs this med or if pharmacy made this request in error. Let me know if she's still having issues! Thanks

## 2018-02-06 ENCOUNTER — Telehealth: Payer: Self-pay

## 2018-02-06 NOTE — Telephone Encounter (Signed)
Thanks, I think we can wait to see if she is still taking this. This was prescribed for acute GI issue and she may not actually be needing it anymore.

## 2018-02-06 NOTE — Telephone Encounter (Signed)
As per pt, she would like a med RF for [facility]ep[facility]ole sent to [facility]. She continues to have symptoms.

## 2018-02-06 NOTE — Telephone Encounter (Signed)
Noted. Still awaiting for a call back from pt.

## 2018-02-06 NOTE — Telephone Encounter (Signed)
[facility] requesting med RF for omeprazole. Tried contacting pt to see if she is still having symptoms. No answer, left vm msg to return call. Direct call back information provided. Pls advise if RF appropriate. Thanks.

## 2018-02-07 NOTE — Telephone Encounter (Signed)
Left a detailed msg for pt re: med RF & provider's note. Call back information provided.

## 2018-02-07 NOTE — Telephone Encounter (Signed)
Refill sent but these let her know that if she is continuing to have severe symptoms of abdominal pain or acid reflux, we may want to think about sending her to a GI doctor for further evaluation, or she can follow up here to discuss

## 2018-02-10 ENCOUNTER — Encounter: Payer: Self-pay | Admitting: Osteopathic Medicine

## 2018-02-10 ENCOUNTER — Ambulatory Visit (INDEPENDENT_AMBULATORY_CARE_PROVIDER_SITE_OTHER): Payer: Medicare HMO | Admitting: Osteopathic Medicine

## 2018-02-10 VITALS — BP 113/65 | HR 59 | Temp 97.9°F | Wt 129.7 lb

## 2018-02-10 DIAGNOSIS — R1310 Dysphagia, unspecified: Secondary | ICD-10-CM

## 2018-02-10 DIAGNOSIS — F458 Other somatoform disorders: Secondary | ICD-10-CM | POA: Diagnosis not present

## 2018-02-10 DIAGNOSIS — R1319 Other dysphagia: Secondary | ICD-10-CM

## 2018-02-10 DIAGNOSIS — R002 Palpitations: Secondary | ICD-10-CM

## 2018-02-10 DIAGNOSIS — R0989 Other specified symptoms and signs involving the circulatory and respiratory systems: Secondary | ICD-10-CM

## 2018-02-10 DIAGNOSIS — R69 Illness, unspecified: Secondary | ICD-10-CM | POA: Diagnosis not present

## 2018-02-10 DIAGNOSIS — R198 Other specified symptoms and signs involving the digestive system and abdomen: Secondary | ICD-10-CM

## 2018-02-10 NOTE — Progress Notes (Signed)
HPI: Jamie Stephens is a 72 y.o. female who  has a past medical history of Anxiety disorder (03/02/2013), Atrophic vaginitis (09/04/2017), Dry eye (06/24/2016), Family history of colon cancer (03/02/2013), GERD (gastroesophageal reflux disease) (02/28/2011), Kidney stone (09/01/2013), Non-rheumatic mitral regurgitation (06/24/2016), Osteopenia (06/22/2016), Osteoporosis without current pathological fracture (06/24/2016), and Palpitations (06/24/2016).  she presents to [facility] today, 02/10/18,  for chief complaint of:  Feeling like food getting stuck  Today she reports feeling like things are getting stuck may be midway through the esophagus when she is swallowing, like her belly is too full for anything else to go down.  She is belching fairly frequently.  There is no regurgitation or vomiting.  Previous EGD a few years ago for similar symptoms did not show any concerns.    Has gained a bit of weight in the past few months.  Wonders if the weight gain may be pushing on the stomach in a way that is contributing to her symptoms.  She has concerns that the beta-blocker prescribed by her cardiologist for rotations may have had something to do with it so she is not taking this medication, or only taking it intermittently when she experiences palpitation symptoms.  No chest pains or trouble breathing    Past medical history, surgical history, and family history reviewed.  Current medication list and allergy/intolerance information reviewed.   (See remainder of HPI, ROS, Phys Exam below)    ASSESSMENT/PLAN:   Esophageal dysphagia - Plan: Ambulatory referral to Gastroenterology  Globus sensation - Plan: Ambulatory referral to Gastroenterology  Palpitations     Patient Instructions  Consider getting back on the acebutolol  Will refer to GI for likely need EGD    Follow-up plan: Return if symptoms worsen or fail to  improve.     ############################################ ############################################ ############################################ ############################################    Outpatient Encounter Medications as of 02/10/2018  Medication Sig  . alendronate (FOSAMAX) 35 MG tablet Take 1 tablet (35 mg total) by mouth every 7 (seven) days.  . Multiple Vitamins-Minerals (MULTIVITAMIN WITH MINERALS) tablet Take 1 tablet by mouth daily.  Marland Kitchen omeprazole (PRILOSEC) 40 MG capsule Take 1 capsule (40 mg total) by mouth daily.   No facility-administered encounter medications on file as of 02/10/2018.    No Known Allergies    Review of Systems:  Constitutional: No recent illness  HEENT: No  headache, no vision change  Cardiac: No  chest pain, No  pressure, +palpitations  Respiratory:  No  shortness of breath. No  Cough  Gastrointestinal: +abdominal pain, no change in bowel habits, see HPI  Musculoskeletal: No new myalgia/arthralgia  Neurologic: No  weakness, No  Dizziness   Exam:  BP 113/65 (BP Location: Left Arm, Patient Position: Sitting, Cuff Size: Normal)   Pulse (!) 59   Temp 97.9 F (36.6 C) (Oral)   Wt 129 lb 11.2 oz (58.8 kg)   BMI 23.72 kg/m   Constitutional: VS see above. General Appearance: alert, well-developed, well-nourished, NAD  Eyes: Normal lids and conjunctive, non-icteric sclera  Ears, Nose, Mouth, Throat: MMM, Normal external inspection ears/nares/mouth/lips/gums.  Neck: No masses, trachea midline.   Respiratory: Normal respiratory effort. no wheeze, no rhonchi, no rales  Cardiovascular: S1/S2 normal, no murmur, no rub/gallop auscultated. RRR.   Abdominal: Nontender, nondistended.  Normal percussion.  Rectal exam deferred.  Bowel sounds within normal limits x4  Musculoskeletal: Gait normal. Symmetric and independent movement of all extremities  Neurological: Normal balance/coordination. No tremor.  Skin: warm, dry, intact.  Psychiatric: Normal judgment/insight. Normal mood and affect. Oriented x3.   Visit summary with medication list and pertinent instructions was printed for patient to review, advised to alert [place] if any changes needed. All questions at time of visit were answered - patient instructed to contact office with any additional concerns. ER/RTC precautions were reviewed with the patient and understanding verbalized.   Follow-up plan: Return if symptoms worsen or fail to improve.  Note: Total time spent 25 minutes, greater than 50% of the visit was spent face-to-face counseling and coordinating care for the following: The primary encounter diagnosis was Esophageal dysphagia. Diagnoses of Globus sensation and Palpitations were also pertinent to this visit.Marland Kitchen  Please note: voice recognition software was used to produce this document, and typos may escape review. Please contact Dr. Sheppard Coil for any needed clarifications.

## 2018-02-10 NOTE — Patient Instructions (Signed)
Consider getting back on the acebutolol  Will refer to GI for likely need EGD

## 2018-02-11 ENCOUNTER — Ambulatory Visit: Payer: Medicare HMO | Admitting: Osteopathic Medicine

## 2018-02-11 ENCOUNTER — Encounter: Payer: Self-pay | Admitting: Gastroenterology

## 2018-02-12 NOTE — Telephone Encounter (Signed)
Pt was seen on 02/11/18 to review med RF request.

## 2018-02-13 ENCOUNTER — Other Ambulatory Visit: Payer: Self-pay

## 2018-02-13 MED ORDER — OMEPRAZOLE 40 MG PO CPDR: 40.0000 mg | DELAYED_RELEASE_CAPSULE | Freq: Every day | ORAL | 0 refills | Status: DC

## 2018-02-14 DIAGNOSIS — K219 Gastro-esophageal reflux disease without esophagitis: Secondary | ICD-10-CM | POA: Diagnosis not present

## 2018-02-14 DIAGNOSIS — R1310 Dysphagia, unspecified: Secondary | ICD-10-CM | POA: Diagnosis not present

## 2018-02-14 DIAGNOSIS — R1319 Other dysphagia: Secondary | ICD-10-CM | POA: Insufficient documentation

## 2018-02-17 ENCOUNTER — Ambulatory Visit (INDEPENDENT_AMBULATORY_CARE_PROVIDER_SITE_OTHER): Payer: Medicare HMO | Admitting: Podiatry

## 2018-02-17 ENCOUNTER — Encounter: Payer: Self-pay | Admitting: Podiatry

## 2018-02-17 VITALS — BP 104/60 | HR 72 | Ht 62.0 in | Wt 129.0 lb

## 2018-02-17 DIAGNOSIS — L600 Ingrowing nail: Secondary | ICD-10-CM | POA: Diagnosis not present

## 2018-02-17 DIAGNOSIS — B351 Tinea unguium: Secondary | ICD-10-CM | POA: Diagnosis not present

## 2018-02-17 DIAGNOSIS — M79671 Pain in right foot: Secondary | ICD-10-CM

## 2018-02-17 NOTE — Progress Notes (Signed)
SUBJECTIVE: 72 y.o. year old female presents complaining of abnormal nail growth for over a year. The toe nail was hit last year and been growing thick on top.  Review of Systems  Constitutional: Negative.   HENT: Negative.   Eyes: Negative.   Respiratory: Negative.   Cardiovascular: Negative.   Gastrointestinal: Negative.   Genitourinary: Negative.   Musculoskeletal: Negative.   Skin: Negative.      OBJECTIVE: DERMATOLOGIC EXAMINATION: Thick ingrown and deformed left great toe nail.  VASCULAR EXAMINATION OF LOWER LIMBS: All pedal pulses are palpable with normal pulsation.  Capillary Filling times within 3 seconds in all digits.  No edema or erythema noted. Temperature gradient from tibial crest to dorsum of foot is within normal bilateral.  NEUROLOGIC EXAMINATION OF THE LOWER LIMBS: All epicritic and tactile sensations grossly intact. Sharp and Dull discriminatory sensations at the plantar ball of hallux is intact bilateral.   MUSCULOSKELETAL EXAMINATION: Rectus foot.  ASSESSMENT: History of nail injury left great toe. Ingrown mycotic nail left great toe.  PLAN: Reviewed findings and available treatment options. Left great toe nail reduced and grinded. May return in 3 month for repeat treatment.

## 2018-02-17 NOTE — Patient Instructions (Signed)
Seen for abnormal nail growth left great toe for over a year. Nail debrided and grinded. May return in 3 month for routine foot care.

## 2018-02-21 DIAGNOSIS — K219 Gastro-esophageal reflux disease without esophagitis: Secondary | ICD-10-CM | POA: Diagnosis not present

## 2018-02-21 DIAGNOSIS — R1310 Dysphagia, unspecified: Secondary | ICD-10-CM | POA: Diagnosis not present

## 2018-02-21 DIAGNOSIS — K449 Diaphragmatic hernia without obstruction or gangrene: Secondary | ICD-10-CM | POA: Diagnosis not present

## 2018-02-25 DIAGNOSIS — Z809 Family history of malignant neoplasm, unspecified: Secondary | ICD-10-CM | POA: Diagnosis not present

## 2018-02-25 DIAGNOSIS — M8580 Other specified disorders of bone density and structure, unspecified site: Secondary | ICD-10-CM | POA: Diagnosis not present

## 2018-02-25 DIAGNOSIS — Z7983 Long term (current) use of bisphosphonates: Secondary | ICD-10-CM | POA: Diagnosis not present

## 2018-02-25 DIAGNOSIS — Z8249 Family history of ischemic heart disease and other diseases of the circulatory system: Secondary | ICD-10-CM | POA: Diagnosis not present

## 2018-02-25 DIAGNOSIS — K219 Gastro-esophageal reflux disease without esophagitis: Secondary | ICD-10-CM | POA: Diagnosis not present

## 2018-02-25 DIAGNOSIS — I499 Cardiac arrhythmia, unspecified: Secondary | ICD-10-CM | POA: Diagnosis not present

## 2018-02-28 ENCOUNTER — Encounter: Payer: Self-pay | Admitting: Osteopathic Medicine

## 2018-03-11 ENCOUNTER — Other Ambulatory Visit: Payer: Self-pay | Admitting: Osteopathic Medicine

## 2018-04-08 ENCOUNTER — Ambulatory Visit: Payer: Medicare HMO | Admitting: Gastroenterology

## 2018-04-10 DIAGNOSIS — N3000 Acute cystitis without hematuria: Secondary | ICD-10-CM | POA: Diagnosis not present

## 2018-04-10 DIAGNOSIS — R350 Frequency of micturition: Secondary | ICD-10-CM | POA: Diagnosis not present

## 2018-05-07 ENCOUNTER — Other Ambulatory Visit: Payer: Self-pay

## 2018-05-07 MED ORDER — ACEBUTOLOL HCL 200 MG PO CAPS: 200.0000 mg | ORAL_CAPSULE | Freq: Every day | ORAL | 0 refills | Status: DC

## 2018-05-07 NOTE — Telephone Encounter (Signed)
Patient notified and will call and set up appointment with cardiology. KG LPN

## 2018-05-07 NOTE — Telephone Encounter (Signed)
Jamie Stephens called to get a refill on acebutolol 200 mg. Not on current medication list. She states the cardiologist will not fill it without a visit and she is going out of town. Please advise.

## 2018-05-07 NOTE — Telephone Encounter (Signed)
Reviewed cariology notes form 09/2017, she was supposed to follow up with them 4 weeks after then.  I sent 2 week of meds but she needs to see cardiology - no more refills from me

## 2018-05-12 ENCOUNTER — Ambulatory Visit: Payer: Medicare HMO | Admitting: Podiatry

## 2018-05-12 ENCOUNTER — Encounter: Payer: Self-pay | Admitting: Podiatry

## 2018-05-12 DIAGNOSIS — L600 Ingrowing nail: Secondary | ICD-10-CM | POA: Diagnosis not present

## 2018-05-12 DIAGNOSIS — M898X9 Other specified disorders of bone, unspecified site: Secondary | ICD-10-CM | POA: Diagnosis not present

## 2018-05-12 NOTE — Patient Instructions (Signed)
Seen for deformed nail right great toe. Affected nail debrided and grinded. Return as needed.

## 2018-05-12 NOTE — Progress Notes (Signed)
Subjective: 73 y.o. year old female patient presents for follow up on right great toe nail. It has been taken out and new nail is growing in thickness and not growing out.  Objective: Dermatologic: Thick yellow deformed right great toe nail following an injury. Vascular: Pedal pulses are all palpable. Orthopedic: Enlarged distal phalanx with excess bone formation at the distal end left great toe. Neurologic: All epicritic and tactile sensations grossly intact.  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 Subungual exostosis right great toe.  Treatment: Affected nail grinded down thin.  Reviewed possible procedure resection of excess bone from distal phalanx right great toe.  Return as needed.

## 2018-05-30 DIAGNOSIS — K5904 Chronic idiopathic constipation: Secondary | ICD-10-CM | POA: Diagnosis not present

## 2018-05-30 DIAGNOSIS — N952 Postmenopausal atrophic vaginitis: Secondary | ICD-10-CM | POA: Diagnosis not present

## 2018-05-30 DIAGNOSIS — R300 Dysuria: Secondary | ICD-10-CM | POA: Diagnosis not present

## 2018-06-04 DIAGNOSIS — I491 Atrial premature depolarization: Secondary | ICD-10-CM | POA: Insufficient documentation

## 2018-06-04 NOTE — Progress Notes (Signed)
Cardiology Office Note:    Date:  06/05/2018   ID:  Jamie Stephens, DOB 1946-07-04, MRN [id number]  PCP:  Emeterio Reeve, DO  Cardiologist:  Shirlee More, MD    Referring MD: Emeterio Reeve, DO    ASSESSMENT:    1. Palpitations   2. APC (atrial premature contractions)    PLAN:    In order of problems listed above:  1.   Her device capture to 2% incidence of atrial premature contractions and brief runs of APCs and PAT.  He continues to have intermittent symptoms that happened at night her heart races it makes her apprehensive she is unable to sleep.  She is concerned about her risk of atrial fibrillation her son was recently diagnosed.  She is hesitant to take beta-blocker on a regular basis because of daytime side effects we decided to give her a small amount of short acting beta-blocker in the evening and allow her to choose whether to take full or half dose.  She will avoid her proarrhythmic medications I asked her to consider buying a Kardia to screen her for atrial fibrillation   Next appointment: 6 months   Medication Adjustments/Labs and Tests Ordered: Current medicines are reviewed at length with the patient today.  Concerns regarding medicines are outlined above.  No orders of the defined types were placed in this encounter.  No orders of the defined types were placed in this encounter.   Chief Complaint  Patient presents with  . Palpitations    History of Present Illness:    Jamie Stephens is a 72 y.o. female with a hx of palpitation last seen 09/06/17. A Holter monitor showed occasional atrial premature beats with frequent couplets and 3 brief episodes of atrial tachycardia the longest 10 complexes rates up to 124 bpm.  Her echocardiogram showed normal left ventricular size and function and was normal  Compliance with diet, lifestyle and medications: yes Past Medical History:  Diagnosis Date  . Anxiety disorder 03/02/2013  . Atrophic vaginitis 09/04/2017    . Dry eye 06/24/2016  . Family history of colon cancer 03/02/2013  . GERD (gastroesophageal reflux disease) 02/28/2011  . Kidney stone 09/01/2013  . Non-rheumatic mitral regurgitation 06/24/2016  . Osteopenia 06/22/2016  . Osteoporosis without current pathological fracture 06/24/2016  . Palpitations 06/24/2016   normal EKG, infrequent "heart flutters," normal previous exch except mild MR an dno murmur today, labs pending, pt will consider Holter/repeat Echo    Past Surgical History:  Procedure Laterality Date  . ABDOMINAL HYSTERECTOMY  2014   PARTIAL W/BLADDER TACK  . BREAST CYST ASPIRATION Bilateral   . OVARIAN CYST REMOVAL  2004    Current Medications: Current Meds  Medication Sig  . acebutolol (SECTRAL) 200 MG capsule Take 1 capsule (200 mg total) by mouth daily for 14 days.  Marland Kitchen alendronate (FOSAMAX) 35 MG tablet Take 1 tablet (35 mg total) by mouth every 7 (seven) days.  . Multiple Vitamins-Minerals (MULTIVITAMIN WITH MINERALS) tablet Take 1 tablet by mouth daily.  Marland Kitchen omeprazole (PRILOSEC) 40 MG capsule TAKE 1 CAPSULE BY MOUTH EVERY DAY     Allergies:   Patient has no known allergies.   Social History   Socioeconomic History  . Marital status: Widowed    Spouse name: Not on file  . Number of children: Not on file  . Years of education: Not on file  . Highest education level: Not on file  Occupational History  . Not on file  Social Needs  . Financial  resource strain: Not on file  . Food insecurity:    Worry: Not on file    Inability: Not on file  . Transportation needs:    Medical: Not on file    Non-medical: Not on file  Tobacco Use  . Smoking status: Never Smoker  . Smokeless tobacco: Never Used  Substance and Sexual Activity  . Alcohol use: Not Currently  . Drug use: Never  . Sexual activity: Not on file  Lifestyle  . Physical activity:    Days per week: Not on file    Minutes per session: Not on file  . Stress: Not on file  Relationships  . Social  connections:    Talks on phone: Not on file    Gets together: Not on file    Attends religious service: Not on file    Active member of club or organization: Not on file    Attends meetings of clubs or organizations: Not on file    Relationship status: Not on file  Other Topics Concern  . Not on file  Social History Narrative  . Not on file     Family History: The patient's family history includes Cancer in her mother; Congestive Heart Failure in her father; Heart attack in her paternal grandfather and paternal grandmother. There is no history of Breast cancer. ROS:   Please see the history of present illness.    All other systems reviewed and are negative.  EKGs/Labs/Other Studies Reviewed:    The following studies were reviewed today:   Recent Labs: 07/04/2017: TSH 2.81 12/24/2017: ALT 14; BUN 14; Creat 0.71; Hemoglobin 12.8; Platelets 271; Potassium 4.4; Sodium 142  Recent Lipid Panel    Component Value Date/Time   CHOL 195 07/04/2017 1044   TRIG 105 07/04/2017 1044   HDL 74 07/04/2017 1044   CHOLHDL 2.6 07/04/2017 1044   VLDL 15 06/25/2016 0926   LDLCALC 101 (H) 07/04/2017 1044    Physical Exam:    VS:  BP 104/64 (BP Location: Right Arm, Patient Position: Sitting, Cuff Size: Normal)   Pulse 65   Ht 5\' 2"  (1.575 m)   Wt 126 lb 12.8 oz (57.5 kg)   SpO2 97%   BMI 23.19 kg/m     Wt Readings from Last 3 Encounters:  06/05/18 126 lb 12.8 oz (57.5 kg)  02/17/18 129 lb (58.5 kg)  02/10/18 129 lb 11.2 oz (58.8 kg)     GEN:  Well nourished, well developed in no acute distress HEENT: Normal NECK: No JVD; No carotid bruits LYMPHATICS: No lymphadenopathy CARDIAC: RRR, no murmurs, rubs, gallops RESPIRATORY:  Clear to auscultation without rales, wheezing or rhonchi  ABDOMEN: Soft, non-tender, non-distended MUSCULOSKELETAL:  No edema; No deformity  SKIN: Warm and dry NEUROLOGIC:  Alert and oriented x 3 PSYCHIATRIC:  Normal affect    Signed, Shirlee More, MD    06/05/2018 3:41 PM    [facility]

## 2018-06-05 ENCOUNTER — Encounter: Payer: Self-pay | Admitting: Cardiology

## 2018-06-05 ENCOUNTER — Ambulatory Visit: Payer: Medicare HMO | Admitting: Cardiology

## 2018-06-05 VITALS — BP 104/64 | HR 65 | Ht 62.0 in | Wt 126.8 lb

## 2018-06-05 DIAGNOSIS — R002 Palpitations: Secondary | ICD-10-CM

## 2018-06-05 DIAGNOSIS — I491 Atrial premature depolarization: Secondary | ICD-10-CM | POA: Diagnosis not present

## 2018-06-05 MED ORDER — METOPROLOL TARTRATE 25 MG PO TABS: 25.0000 mg | ORAL_TABLET | Freq: Every day | ORAL | 3 refills | Status: DC

## 2018-06-05 NOTE — Patient Instructions (Addendum)
KardiaMobile [email or web address]        FDA-cleared, clinical grade mobile EKG monitor: Jodelle Red is the most clinically-validated mobile EKG used by the world's leading cardiac care medical professionals With Basic service, know instantly if your heart rhythm is normal or if atrial fibrillation is detected, and email the last single EKG recording to yourself or your doctor Premium service, available for purchase through the Kardia app for $9.99 per month or $99 per year, includes unlimited history and storage of your EKG recordings, a monthly EKG summary report to share with your doctor, along with the ability to track your blood pressure, activity and weight Includes one KardiaMobile phone clip FREE SHIPPING: Standard delivery 1-3 business days. Orders placed by 11:00am PST will ship that afternoon. Otherwise, will ship next business day. All orders ship via [employer] from [place], [place], the new wearable EKG by [employer]. Vladimir Faster replaces your original Apple Watch band. The first of its kind, FDA-cleared KardiaBand provides accurate and instant analysis for detecting atrial fibrillation (AF) and normal sinus rhythm in an EKG. Simply place your thumb on the integrated KardiaBand sensor to take a medical-grade EKG in just 30 seconds. Results appear instantly on your Apple Watch. Vladimir Faster is available today for just $199. KardiaBand features are designed exclusively for use with [employer]  - $99 year. The [employer] for [employer] includes [employer]'s revolutionary SmartRhythm monitoring feature. SmartRhythm monitoring uses an intelligent neural network that runs directly on the [employer], constantly acquiring data from the watch's heart rate sensor and its accelerometer. SmartRhythm compares your heart rate to what it expects from your minute-by-minute level of activity. When the network sees a pattern of heart  rate and activity that it does not expect, it notifies you to take an EKG. With [place], peace of mind is just an EKG away.  .      Medication Instructions:  Your physician has recommended you make the following change in your medication:   STOP acebutolol   START metoprolol tartrate (lopressor) 25 mg tablets: Take 0.5 tablet or 1 tablet daily in the evening  Labwork: None  Testing/Procedures: None  Follow-Up: Your physician wants you to follow-up in: 6 months. You will receive a reminder letter in the mail two months in advance. If you don't receive a letter, please call our office to schedule the follow-up appointment.   If you need a refill on your cardiac medications before your next appointment, please call your pharmacy.   Thank you for choosing [facility]! Robyne Peers, RN [phone]   Metoprolol tablets What is this medicine? METOPROLOL (me TOE proe lole) is a beta-blocker. Beta-blockers reduce the workload on the heart and help it to beat more regularly. This medicine is used to treat high blood pressure and to prevent chest pain. It is also used to after a heart attack and to prevent an additional heart attack from occurring. This medicine may be used for other purposes; ask your health care provider or pharmacist if you have questions. COMMON BRAND NAME(S): Lopressor What should I tell my health care provider before I take this medicine? They need to know if you have any of these conditions: -diabetes -heart or vessel disease like slow heart rate, worsening heart failure, heart block, sick sinus syndrome or Raynaud's disease -kidney disease -liver disease -lung or breathing disease, like asthma or emphysema -pheochromocytoma -thyroid disease -an unusual or allergic reaction to metoprolol, other beta-blockers, medicines, foods,  dyes, or preservatives -pregnant or trying to get pregnant -breast-feeding How should I use this medicine? Take this  medicine by mouth with a drink of water. Follow the directions on the prescription label. Take this medicine immediately after meals. Take your doses at regular intervals. Do not take more medicine than directed. Do not stop taking this medicine suddenly. This could lead to serious heart-related effects. Talk to your pediatrician regarding the use of this medicine in children. Special care may be needed. Overdosage: If you think you have taken too much of this medicine contact a poison control center or emergency room at once. NOTE: This medicine is only for you. Do not share this medicine with others. What if I miss a dose? If you miss a dose, take it as soon as you can. If it is almost time for your next dose, take only that dose. Do not take double or extra doses. What may interact with this medicine? This medicine may interact with the following medications: -certain medicines for blood pressure, heart disease, irregular heart beat -certain medicines for depression like monoamine oxidase (MAO) inhibitors, fluoxetine, or paroxetine -clonidine -dobutamine -epinephrine -isoproterenol -reserpine This list may not describe all possible interactions. Give your health care provider a list of all the medicines, herbs, non-prescription drugs, or dietary supplements you use. Also tell them if you smoke, drink alcohol, or use illegal drugs. Some items may interact with your medicine. What should I watch for while using this medicine? Visit your doctor or health care professional for regular check ups. Contact your doctor right away if your symptoms worsen. Check your blood pressure and pulse rate regularly. Ask your health care professional what your blood pressure and pulse rate should be, and when you should contact them. You may get drowsy or dizzy. Do not drive, use machinery, or do anything that needs mental alertness until you know how this medicine affects you. Do not sit or stand up quickly,  especially if you are an older patient. This reduces the risk of dizzy or fainting spells. Contact your doctor if these symptoms continue. Alcohol may interfere with the effect of this medicine. Avoid alcoholic drinks. What side effects may I notice from receiving this medicine? Side effects that you should report to your doctor or health care professional as soon as possible: -allergic reactions like skin rash, itching or hives -cold or numb hands or feet -depression -difficulty breathing -faint -fever with sore throat -irregular heartbeat, chest pain -rapid weight gain -swollen legs or ankles Side effects that usually do not require medical attention (report to your doctor or health care professional if they continue or are bothersome): -anxiety or nervousness -change in sex drive or performance -dry skin -headache -nightmares or trouble sleeping -short term memory loss -stomach upset or diarrhea -unusually tired This list may not describe all possible side effects. Call your doctor for medical advice about side effects. You may report side effects to FDA at 1-800-FDA-1088. Where should I keep my medicine? Keep out of the reach of children. Store at room temperature between 15 and 30 degrees C (59 and 86 degrees F). Throw away any unused medicine after the expiration date. NOTE: This sheet is a summary. It may not cover all possible information. If you have questions about this medicine, talk to your doctor, pharmacist, or health care provider.  2018 Elsevier/Gold Standard (2013-04-24 14:40:36)

## 2018-06-17 DIAGNOSIS — K219 Gastro-esophageal reflux disease without esophagitis: Secondary | ICD-10-CM | POA: Diagnosis not present

## 2018-06-17 DIAGNOSIS — R1310 Dysphagia, unspecified: Secondary | ICD-10-CM | POA: Diagnosis not present

## 2018-07-29 DIAGNOSIS — Z1231 Encounter for screening mammogram for malignant neoplasm of breast: Secondary | ICD-10-CM | POA: Diagnosis not present

## 2018-07-29 LAB — HM MAMMOGRAPHY

## 2018-08-21 ENCOUNTER — Encounter: Payer: Self-pay | Admitting: Osteopathic Medicine

## 2018-09-09 ENCOUNTER — Telehealth: Payer: Self-pay

## 2018-09-09 MED ORDER — METOPROLOL TARTRATE 25 MG PO TABS: 25.0000 mg | ORAL_TABLET | Freq: Every day | ORAL | 3 refills | Status: DC

## 2018-09-09 NOTE — Telephone Encounter (Signed)
Rx for metoprolol sent to pharmacy.

## 2018-09-10 ENCOUNTER — Other Ambulatory Visit: Payer: Self-pay

## 2018-10-17 ENCOUNTER — Other Ambulatory Visit: Payer: Self-pay | Admitting: Cardiology

## 2018-10-17 MED ORDER — ACEBUTOLOL HCL 200 MG PO CAPS: 200.0000 mg | ORAL_CAPSULE | Freq: Two times a day (BID) | ORAL | 2 refills | Status: DC

## 2018-10-17 NOTE — Telephone Encounter (Signed)
Patient states that she is having more frequent episodes of palpitations being on metoprolol.  Patient would like to see if she can go back on acebutolol.   Please advise.

## 2018-10-17 NOTE — Telephone Encounter (Signed)
Resume at previous dose

## 2018-10-17 NOTE — Telephone Encounter (Signed)
This is a change of pharmacies!!! Patient would like to go back to whatever she was on prior to Metoprolol if possible.   1. Which medications need to be refilled? (please list name of each medication and dose if known) Metoprolol tartrate 25mg   2. Which pharmacy/location (including street and city if local pharmacy) is medication to be sent to?[facility] in [place]  3. Do they need a 30 day or 90 day supply? [place]

## 2018-10-17 NOTE — Telephone Encounter (Signed)
Patient advised to stop metoprolol and start acebutolol 200mg  one capsule twice daily as directed per Dr Bettina Gavia.  Rx sent to pharmacy.  Patient agreed to plan and verbalized understanding.

## 2018-12-03 ENCOUNTER — Other Ambulatory Visit: Payer: Self-pay | Admitting: Cardiology

## 2019-01-07 ENCOUNTER — Telehealth: Payer: Self-pay | Admitting: Plastic Surgery

## 2019-01-07 NOTE — Telephone Encounter (Signed)
Called patient to confirm appointment scheduled for tomorrow. Patient answered the following questions: °1.Has the patient traveled outside of the state of [place] at all within the past 6 weeks? No °2.Does the patient have a fever or cough at all? No °3.Has the patient been tested for COVID? Had a positive COVID test? No °4. Has the patient been in contact with anyone who has tested positive? No ° °

## 2019-01-08 ENCOUNTER — Other Ambulatory Visit: Payer: Self-pay

## 2019-01-08 ENCOUNTER — Encounter: Payer: Self-pay | Admitting: Plastic Surgery

## 2019-01-08 ENCOUNTER — Ambulatory Visit (INDEPENDENT_AMBULATORY_CARE_PROVIDER_SITE_OTHER): Payer: Self-pay | Admitting: Plastic Surgery

## 2019-01-08 VITALS — BP 118/71 | HR 67 | Temp 98.0°F | Ht 62.0 in | Wt 125.0 lb

## 2019-01-08 DIAGNOSIS — Z719 Counseling, unspecified: Secondary | ICD-10-CM

## 2019-01-08 NOTE — Progress Notes (Signed)
Botulinum Toxin Procedure Note  Procedure: Cosmetic botulinum toxin   Pre-operative Diagnosis: Dynamic rhytides  Post-operative Diagnosis: Same  Complications:  None  Brief history: The patient desires botulinum toxin injection of her forehead. I discussed with the patient this proposed procedure of botulinum toxin injections, which is customized depending on the particular needs of the patient. It is performed on facial rhytids as a temporary correction. The alternatives were discussed with the patient. The risks were addressed including bleeding, scarring, infection, damage to deeper structures, asymmetry, and chronic pain, which may occur infrequently after a procedure. The individual's choice to undergo a surgical procedure is based on the comparison of risks to potential benefits. Other risks include unsatisfactory results, brow ptosis, eyelid ptosis, allergic reaction, temporary paralysis, which should go away with time, bruising, blurring disturbances and delayed healing. Botulinum toxin injections do not arrest the aging process or produce permanent tightening of the eyelid.  Operative intervention maybe necessary to maintain the results of a blepharoplasty or botulinum toxin. The patient understands and wishes to proceed. An informed consent was signed and informational brochures given to her prior to the procedure.  Procedure: The area was prepped with alcohol and dried with a clean gauze. Using a clean technique, the botulinum toxin was diluted with 1.25 cc of preservative-free normal saline which was slowly injected with an 18 gauge needle in a tuberculin syringes.  A 32 gauge needles were then used to inject the botulinum toxin. This mixture allow for an aliquot of 5 units per 0.1 cc in each injection site.    Subsequently the mixture was injected in the glabellar and forehead area with preservation of the temporal branch to the lateral eyebrow as well as into each lateral canthal area  beginning from the lateral orbital rim medial to the zygomaticus major in 3 separate areas. A total of 30 Units of botulinum toxin was used. The forehead and glabellar area was injected with care to inject intramuscular only while holding pressure on the supratrochlear vessels in each area during each injection on either side of the medial corrugators. The injection proceeded vertically superiorly to the medial 2/3 of the frontalis muscle and superior 2/3 of the lateral frontalis, again with preservation of the frontal branch.  No complications were noted. Light pressure was held for 5 minutes. She was instructed explicitly in post-operative care.  Botox LOT:  [id number] C2 EXP:  6/22

## 2019-02-06 ENCOUNTER — Encounter: Payer: Self-pay | Admitting: Plastic Surgery

## 2019-02-06 ENCOUNTER — Ambulatory Visit (INDEPENDENT_AMBULATORY_CARE_PROVIDER_SITE_OTHER): Payer: Self-pay | Admitting: Plastic Surgery

## 2019-02-06 ENCOUNTER — Ambulatory Visit: Payer: Self-pay | Admitting: Plastic Surgery

## 2019-02-06 ENCOUNTER — Other Ambulatory Visit: Payer: Self-pay

## 2019-02-06 VITALS — BP 122/71 | HR 78 | Temp 98.4°F | Ht 62.0 in | Wt 124.8 lb

## 2019-02-06 DIAGNOSIS — Z719 Counseling, unspecified: Secondary | ICD-10-CM

## 2019-02-06 NOTE — Progress Notes (Signed)
Preoperative Dx: redness and hyperpigmentation of face  Postoperative Dx:  same  Procedure: laser to face   Anesthesia: none  Description of Procedure:  Risks and complications were explained to the patient. Consent was confirmed and signed. Time out was called and all information was confirmed to be correct. The area  area was prepped with alcohol and wiped dry. The IPL laser was set at 7.4 J/cm2. The face was lasered. The patient tolerated the procedure well and there were no complications. The patient is to follow up in 4 weeks.

## 2019-06-11 ENCOUNTER — Telehealth: Payer: Self-pay

## 2019-06-11 NOTE — Telephone Encounter (Signed)

## 2019-06-12 ENCOUNTER — Encounter: Payer: Self-pay | Admitting: Plastic Surgery

## 2019-06-12 ENCOUNTER — Ambulatory Visit (INDEPENDENT_AMBULATORY_CARE_PROVIDER_SITE_OTHER): Payer: Self-pay | Admitting: Plastic Surgery

## 2019-06-12 ENCOUNTER — Other Ambulatory Visit: Payer: Self-pay

## 2019-06-12 VITALS — BP 111/72 | HR 72 | Temp 96.8°F | Ht 62.0 in | Wt 126.6 lb

## 2019-06-12 DIAGNOSIS — Z719 Counseling, unspecified: Secondary | ICD-10-CM

## 2019-06-12 MED ORDER — TRETINOIN 0.1 % EX CREA: TOPICAL_CREAM | Freq: Every day | CUTANEOUS | 0 refills | Status: DC

## 2019-06-12 NOTE — Progress Notes (Signed)
Botulinum Toxin Procedure Note  Procedure: Cosmetic botulinum toxin   Pre-operative Diagnosis: Dynamic rhytides   Post-operative Diagnosis: Same  Complications:  None  Brief history: The patient desires botulinum toxin injection of her forehead. I discussed with the patient this proposed procedure of botulinum toxin injections, which is customized depending on the particular needs of the patient. It is performed on facial rhytids as a temporary correction. The alternatives were discussed with the patient. The risks were addressed including bleeding, scarring, infection, damage to deeper structures, asymmetry, and chronic pain, which may occur infrequently after a procedure. The individual's choice to undergo a surgical procedure is based on the comparison of risks to potential benefits. Other risks include unsatisfactory results, brow ptosis, eyelid ptosis, allergic reaction, temporary paralysis, which should go away with time, bruising, blurring disturbances and delayed healing. Botulinum toxin injections do not arrest the aging process or produce permanent tightening of the eyelid.  Operative intervention maybe necessary to maintain the results of a blepharoplasty or botulinum toxin. The patient understands and wishes to proceed. An informed consent was signed and informational brochures given to her prior to the procedure.  Procedure: The area was prepped with alcohol and dried with a clean gauze. Using a clean technique, the botulinum toxin was diluted with 1.25 cc of preservative-free normal saline which was slowly injected with an 18 gauge needle in a tuberculin syringes.  A 32 gauge needles were then used to inject the botulinum toxin. This mixture allow for an aliquot of 5 units per 0.1 cc in each injection site.    Subsequently the mixture was injected in the glabellar and forehead area with preservation of the temporal branch to the lateral eyebrow as well as into each lateral canthal area  beginning from the lateral orbital rim medial to the zygomaticus major in 3 separate areas. A total of 30 Units of botulinum toxin was used. The forehead and glabellar area was injected with care to inject intramuscular only while holding pressure on the supratrochlear vessels in each area during each injection on either side of the medial corrugators. The injection proceeded vertically superiorly to the medial 2/3 of the frontalis muscle and superior 2/3 of the lateral frontalis, again with preservation of the frontal branch.  No complications were noted. Light pressure was held for 5 minutes. She was instructed explicitly in post-operative care.  Botox LOT:  [id number] C2 EXP:  5/23

## 2019-06-26 ENCOUNTER — Other Ambulatory Visit: Payer: Self-pay | Admitting: Cardiology

## 2019-06-26 MED ORDER — METOPROLOL TARTRATE 25 MG PO TABS: ORAL_TABLET | ORAL | 0 refills | Status: DC

## 2019-06-26 NOTE — Telephone Encounter (Signed)
Patient is requesting to go back to taking metoprolol tartrate. Please advise.

## 2019-06-26 NOTE — Telephone Encounter (Signed)
Patient requesting refill for metoprolol tartrate to get her to her follow up appointment on 08/17/2019 at 11:00 am in the [facility] office. Refill sent to [facility] in [place]. No further questions.

## 2019-08-16 NOTE — Progress Notes (Signed)
Cardiology Office Note:    Date:  08/17/2019   ID:  Jamie Stephens, DOB [birth year], MRN [id number]  PCP:  Emeterio Reeve, DO  Cardiologist:  Shirlee More, MD    Referring MD: Emeterio Reeve, DO    ASSESSMENT:    1. Palpitations   2. Non-rheumatic mitral regurgitation   3. APC (atrial premature contractions)    PLAN:    In order of problems listed above:  1. Stable continue beta-blocker medication renewed 2. Stable I do not see the need to recheck echocardiogram this year 3. Stable continue beta-blocker   Next appointment: 1 year   Medication Adjustments/Labs and Tests Ordered: Current medicines are reviewed at length with the patient today.  Concerns regarding medicines are outlined above.  Orders Placed This Encounter  Procedures  . EKG 12-Lead   Meds ordered this encounter  Medications  . metoprolol tartrate (LOPRESSOR) 25 MG tablet    Sig: Take 0.5 tablet (12.5 mg) or 1 tablet (25 mg) daily in the evening.    Dispense:  90 tablet    Refill:  3    Chief Complaint  Patient presents with  . Palpitations    History of Present Illness:    Jamie Stephens is a 73 y.o. female with a hx of palpitation last seen 06/05/2018. Her Holter monitor showed occasional atrial premature beats with frequent couplets and 3 brief episodes of atrial tachycardia the longest 10 complexes rates up to 124 bpm.  Her echocardiogram showed normal left ventricular size and function and was normal   Compliance with diet, lifestyle and medications: Yes  This is with stress in her personal life she is more aware of her heart but symptoms are well controlled on the beta-blocker we will continue the same.  No chest pain syncope TIA. Past Medical History:  Diagnosis Date  . Anxiety disorder 03/02/2013  . 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 Dry eye 06/24/2016  . Family history of colon cancer 03/02/2013  . GERD (gastroesophageal reflux disease) 02/28/2011  . Kidney stone 09/01/2013  .  Non-rheumatic mitral regurgitation 06/24/2016  . Osteopenia 06/22/2016  . Osteoporosis without current pathological fracture 06/24/2016  . Palpitations 06/24/2016   normal EKG, infrequent "heart flutters," normal previous exch except mild MR an dno murmur today, labs pending, pt will consider Holter/repeat Echo    Past Surgical History:  Procedure Laterality Date  . ABDOMINAL HYSTERECTOMY  2014   PARTIAL W/BLADDER TACK  . BREAST CYST ASPIRATION Bilateral   . OVARIAN CYST REMOVAL  2004    Current Medications: Current Meds  Medication Sig  . alendronate (FOSAMAX) 35 MG tablet Take 1 tablet (35 mg total) by mouth every 7 (seven) days.  . metoprolol tartrate (LOPRESSOR) 25 MG tablet Take 0.5 tablet (12.5 mg) or 1 tablet (25 mg) daily in the evening.  . Multiple Vitamins-Minerals (MULTIVITAMIN WITH MINERALS) tablet Take 1 tablet by mouth daily.  Marland Kitchen omeprazole (PRILOSEC) 40 MG capsule TAKE 1 CAPSULE BY MOUTH EVERY DAY  . tretinoin (RETIN-A) 0.1 % cream Apply topically at bedtime.  . [DISCONTINUED] metoprolol tartrate (LOPRESSOR) 25 MG tablet Take 0.5 tablet (12.5 mg) or 1 tablet (25 mg) daily in the evening.     Allergies:   Patient has no known allergies.   Social History   Socioeconomic History  . Marital status: Widowed    Spouse name: Not on file  . Number of children: Not on file  . Years of education: Not on file  . Highest education level: Not on file  Occupational History  . Not on file  Tobacco Use  . Smoking status: Never Smoker  . Smokeless tobacco: Never Used  Substance and Sexual Activity  . Alcohol use: Not Currently  . Drug use: Never  . Sexual activity: Not on file  Other Topics Concern  . Not on file  Social History Narrative  . Not on file   Social Determinants of Health   Financial Resource Strain:   . Difficulty of Paying Living Expenses: Not on file  Food Insecurity:   . Worried About Charity fundraiser in the Last Year: Not on file  . Ran Out of  Food in the Last Year: Not on file  Transportation Needs:   . Lack of Transportation (Medical): Not on file  . Lack of Transportation (Non-Medical): Not on file  Physical Activity:   . Days of Exercise per Week: Not on file  . Minutes of Exercise per Session: Not on file  Stress:   . Feeling of Stress : Not on file  Social Connections:   . Frequency of Communication with Friends and Family: Not on file  . Frequency of Social Gatherings with Friends and Family: Not on file  . Attends Religious Services: Not on file  . Active Member of Clubs or Organizations: Not on file  . Attends Archivist Meetings: Not on file  . Marital Status: Not on file     Family History: The patient's family history includes Cancer in her mother; Congestive Heart Failure in her father; Heart attack in her paternal grandfather and paternal grandmother. There is no history of Breast cancer. ROS:   Please see the history of present illness.    All other systems reviewed and are negative.  EKGs/Labs/Other Studies Reviewed:    The following studies were reviewed today:  EKG:  EKG ordered today and personally reviewed.  The ekg ordered today demonstrates sinus rhythm normal EKG  Recent Labs: No results found for requested labs within last 8760 hours.  Recent Lipid Panel    Component Value Date/Time   CHOL 195 07/04/2017 1044   TRIG 105 07/04/2017 1044   HDL 74 07/04/2017 1044   CHOLHDL 2.6 07/04/2017 1044   VLDL 15 06/25/2016 0926   LDLCALC 101 (H) 07/04/2017 1044    Physical Exam:    VS:  BP 102/66   Pulse 77   Ht 5\' 2"  (1.575 m)   Wt 125 lb (56.7 kg)   BMI 22.86 kg/m     Wt Readings from Last 3 Encounters:  08/17/19 125 lb (56.7 kg)  06/12/19 126 lb 9.6 oz (57.4 kg)  02/06/19 124 lb 12.8 oz (56.6 kg)     GEN:  Well nourished, well developed in no acute distress HEENT: Normal NECK: No JVD; No carotid bruits LYMPHATICS: No lymphadenopathy CARDIAC: RRR, no murmurs, rubs,  gallops RESPIRATORY:  Clear to auscultation without rales, wheezing or rhonchi  ABDOMEN: Soft, non-tender, non-distended MUSCULOSKELETAL:  No edema; No deformity  SKIN: Warm and dry NEUROLOGIC:  Alert and oriented x 3 PSYCHIATRIC:  Normal affect    Signed, Shirlee More, MD  08/17/2019 11:53 AM    [place]

## 2019-08-17 ENCOUNTER — Other Ambulatory Visit: Payer: Self-pay

## 2019-08-17 ENCOUNTER — Ambulatory Visit (INDEPENDENT_AMBULATORY_CARE_PROVIDER_SITE_OTHER): Payer: Medicare Other | Admitting: Cardiology

## 2019-08-17 VITALS — BP 102/66 | HR 77 | Ht 62.0 in | Wt 125.0 lb

## 2019-08-17 DIAGNOSIS — I491 Atrial premature depolarization: Secondary | ICD-10-CM

## 2019-08-17 DIAGNOSIS — I340 Nonrheumatic mitral (valve) insufficiency: Secondary | ICD-10-CM

## 2019-08-17 DIAGNOSIS — R002 Palpitations: Secondary | ICD-10-CM | POA: Diagnosis not present

## 2019-08-17 MED ORDER — METOPROLOL TARTRATE 25 MG PO TABS: ORAL_TABLET | ORAL | 3 refills | Status: DC

## 2019-08-17 NOTE — Patient Instructions (Signed)
Medication Instructions:  Your physician recommends that you continue on your current medications as directed. Please refer to the Current Medication list given to you today.  *If you need a refill on your cardiac medications before your next appointment, please call your pharmacy*  Lab Work: None  If you have labs (blood work) drawn today and your tests are completely normal, you will receive your results only by: Marland Kitchen MyChart Message (if you have MyChart) OR . A paper copy in the mail If you have any lab test that is abnormal or we need to change your treatment, we will call you to review the results.  Testing/Procedures: You had an EKG today.   Follow-Up: At [facility], you and your health needs are our priority.  As part of our continuing mission to provide you with exceptional heart care, we have created designated Provider Care Teams.  These Care Teams include your primary Cardiologist (physician) and Advanced Practice Providers (APPs -  Physician Assistants and Nurse Practitioners) who all work together to provide you with the care you need, when you need it.  Your next appointment:   1 year(s)  The format for your next appointment:   In Person  Provider:   Shirlee More, MD

## 2019-09-14 ENCOUNTER — Telehealth: Payer: Self-pay

## 2019-09-14 NOTE — Telephone Encounter (Signed)

## 2019-09-15 ENCOUNTER — Other Ambulatory Visit: Payer: Self-pay

## 2019-09-15 ENCOUNTER — Encounter: Payer: Self-pay | Admitting: Plastic Surgery

## 2019-09-15 ENCOUNTER — Ambulatory Visit (INDEPENDENT_AMBULATORY_CARE_PROVIDER_SITE_OTHER): Payer: Self-pay | Admitting: Plastic Surgery

## 2019-09-15 VITALS — BP 115/72 | HR 70 | Temp 96.9°F | Ht 62.0 in | Wt 125.0 lb

## 2019-09-15 DIAGNOSIS — Z719 Counseling, unspecified: Secondary | ICD-10-CM

## 2019-09-15 NOTE — Progress Notes (Signed)
Filler Injection Procedure Note  Procedure:  Filler administration  Pre-operative Diagnosis: Rytides and midface volume loss  Post-operative Diagnosis: Same  Surgeon: Electronically signed by: Theodoro Kos, DO   Complications:  None  Brief history: The patient desires injection with fillers in her face. I discussed with the patient this proposed procedure of filler injections, which is customized depending on the particular needs of the patient. It is performed on facial volume loss as a temporary correction. The alternatives were discussed with the patient. The risks were addressed including bleeding, scarring, infection, damage to deeper structures, asymmetry, and chronic pain, which may occur infrequently after a procedure. The individual's choice to undergo a surgical procedure is based on the comparison of risks to potential benefits. Other risks include unsatisfactory results, allergic reaction, which should go away with time, bruising and delayed healing. Fillers do not arrest the aging process or produce permanent tightening.  Operative intervention maybe necessary to maintain the results. The patient understands and wishes to proceed. An informed consent was signed and informational brochures given to her prior to the procedure.  Procedure: The area was prepped with chlorhexidine and dried with a clean gauze. Using a clean technique, a 30 gauge needle was then used to inject the filler into the bilateral nasal labial folds. This was done with one syringe.   The midface area was injected at the medial sub-region of the mid-face with 1/2 syringe on each side.  No complications were noted. Light pressure was held for 5 minutes. She was instructed explicitly in post-operative care.  Voluma XC 1 ml [id number]  EXP: 2020-01-18   Restylane Refyne 1 ml LOT: [id number] EXP: 2020-08-02

## 2019-10-27 ENCOUNTER — Ambulatory Visit (INDEPENDENT_AMBULATORY_CARE_PROVIDER_SITE_OTHER): Payer: Self-pay | Admitting: Plastic Surgery

## 2019-10-27 ENCOUNTER — Encounter: Payer: Self-pay | Admitting: Plastic Surgery

## 2019-10-27 ENCOUNTER — Other Ambulatory Visit: Payer: Self-pay

## 2019-10-27 VITALS — BP 116/70 | HR 78 | Temp 97.3°F | Ht 62.0 in | Wt 125.6 lb

## 2019-10-27 DIAGNOSIS — Z719 Counseling, unspecified: Secondary | ICD-10-CM

## 2019-10-27 NOTE — Progress Notes (Signed)
Botulinum Toxin Procedure Note  Procedure: Cosmetic botulinum toxin   Pre-operative Diagnosis: Dynamic rhytides   Post-operative Diagnosis: Same  Complications:  None  Brief history: The patient desires botulinum toxin injection of her forehead. I discussed with the patient this proposed procedure of botulinum toxin injections, which is customized depending on the particular needs of the patient. It is performed on facial rhytids as a temporary correction. The alternatives were discussed with the patient. The risks were addressed including bleeding, scarring, infection, damage to deeper structures, asymmetry, and chronic pain, which may occur infrequently after a procedure. The individual's choice to undergo a surgical procedure is based on the comparison of risks to potential benefits. Other risks include unsatisfactory results, brow ptosis, eyelid ptosis, allergic reaction, temporary paralysis, which should go away with time, bruising, blurring disturbances and delayed healing. Botulinum toxin injections do not arrest the aging process or produce permanent tightening of the eyelid.  Operative intervention maybe necessary to maintain the results of a blepharoplasty or botulinum toxin. The patient understands and wishes to proceed. An informed consent was signed and informational brochures given to her prior to the procedure.  Procedure: The area was prepped with alcohol and dried with a clean gauze. Using a clean technique, the botulinum toxin was diluted with 1.25 cc of preservative-free normal saline which was slowly injected with an 18 gauge needle in a tuberculin syringes.  A 32 gauge needles were then used to inject the botulinum toxin. This mixture allow for an aliquot of 5 units per 0.1 cc in each injection site.    Subsequently the mixture was injected in the lateral periorbrital area. A total of 8 Units of botulinum toxin was used.  No complications were noted. Light pressure was held for 5  minutes. She was instructed explicitly in post-operative care.  Botox LOT:  [id number] EXP:  9/23

## 2020-01-28 DIAGNOSIS — U071 COVID-19: Secondary | ICD-10-CM | POA: Insufficient documentation

## 2020-01-28 DIAGNOSIS — E876 Hypokalemia: Secondary | ICD-10-CM | POA: Insufficient documentation

## 2020-03-14 ENCOUNTER — Ambulatory Visit (INDEPENDENT_AMBULATORY_CARE_PROVIDER_SITE_OTHER): Payer: Self-pay | Admitting: Plastic Surgery

## 2020-03-14 ENCOUNTER — Encounter: Payer: Self-pay | Admitting: Plastic Surgery

## 2020-03-14 ENCOUNTER — Other Ambulatory Visit: Payer: Self-pay

## 2020-03-14 VITALS — BP 108/64 | HR 78 | Temp 97.5°F

## 2020-03-14 DIAGNOSIS — Z719 Counseling, unspecified: Secondary | ICD-10-CM

## 2020-03-14 MED ORDER — TRETINOIN 0.1 % EX CREA: TOPICAL_CREAM | Freq: Every day | CUTANEOUS | 0 refills | Status: DC

## 2020-03-14 NOTE — Progress Notes (Signed)
Botulinum Toxin Procedure Note  Procedure: Cosmetic botulinum toxin  Pre-operative Diagnosis: Dynamic rhytides   Post-operative Diagnosis: Same  Complications:  None  Brief history: The patient desires botulinum toxin injection of her forehead. I discussed with the patient this proposed procedure of botulinum toxin injections, which is customized depending on the particular needs of the patient. It is performed on facial rhytids as a temporary correction. The alternatives were discussed with the patient. The risks were addressed including bleeding, scarring, infection, damage to deeper structures, asymmetry, and chronic pain, which may occur infrequently after a procedure. The individual's choice to undergo a surgical procedure is based on the comparison of risks to potential benefits. Other risks include unsatisfactory results, brow ptosis, eyelid ptosis, allergic reaction, temporary paralysis, which should go away with time, bruising, blurring disturbances and delayed healing. Botulinum toxin injections do not arrest the aging process or produce permanent tightening of the eyelid.  Operative intervention maybe necessary to maintain the results of a blepharoplasty or botulinum toxin. The patient understands and wishes to proceed.  Procedure: The area was prepped with alcohol and dried with a clean gauze. Using a clean technique, the botulinum toxin was diluted with 1.25 cc of preservative-free normal saline which was slowly injected with an 18 gauge needle in a tuberculin syringes.  A 32 gauge needles were then used to inject the botulinum toxin. This mixture allow for an aliquot of 5 units per 0.1 cc in each injection site.    Subsequently the mixture was injected in the glabellar and forehead area with preservation of the temporal branch to the lateral eyebrow as well as into each lateral canthal area beginning from the lateral orbital rim medial to the zygomaticus major in 3 separate areas. A total  of 38 Units of botulinum toxin was used. The forehead and glabellar area was injected with care to inject intramuscular only while holding pressure on the supratrochlear vessels in each area during each injection on either side of the medial corrugators. The injection proceeded vertically superiorly to the medial 2/3 of the frontalis muscle and superior 2/3 of the lateral frontalis, again with preservation of the frontal branch.  The midface area was injected at the 3 sub-regions of the mid-face: zygomaticomalar region, anteromedial cheek region, and submalar region for a total of one syringe on each side of the face. The technique used was serial puncture with equal injections in the 3 sub-regions: the zygomaticomalar region, the anteromedial cheek, and the submalar region.  No complications were noted. Light pressure was held for 5 minutes. She was instructed explicitly in post-operative care.  She is going to try the Sente Intensive cream.  Botox LOT:  [id number] EXP:  11/23

## 2020-03-21 ENCOUNTER — Telehealth: Payer: Self-pay | Admitting: *Deleted

## 2020-03-21 NOTE — Telephone Encounter (Signed)
Received Prior Authorization on (03/15/20) via of fax from [facility] regarding Tretinoin 0.1% cream 45GM.  Called patient and informed her of the PA, and that I spoke with the pharmacy regarding out of pocket cost which is :$154.89-Cash,and $110.99-w/ Discount card.  Also informed her that our office could get it for her but it will be ($199.00) for 20GM.    Patient stated that she will get it at [facility].  Informed the patient that she make sure she ask for the discount card.  Patient verbalized understanding and agreed.//AB/CMA

## 2020-05-10 ENCOUNTER — Other Ambulatory Visit: Payer: Self-pay

## 2020-05-10 ENCOUNTER — Ambulatory Visit (INDEPENDENT_AMBULATORY_CARE_PROVIDER_SITE_OTHER): Payer: Self-pay | Admitting: Plastic Surgery

## 2020-05-10 ENCOUNTER — Encounter: Payer: Self-pay | Admitting: Plastic Surgery

## 2020-05-10 VITALS — BP 121/71 | HR 70 | Temp 98.4°F

## 2020-05-10 DIAGNOSIS — Z719 Counseling, unspecified: Secondary | ICD-10-CM

## 2020-05-10 NOTE — Progress Notes (Signed)
Filler Injection Procedure Note  Procedure:  Filler administration  Pre-operative Diagnosis: Rytides and midface volume loss  Post-operative Diagnosis: Same  Surgeon: Electronically signed by: Theodoro Kos, DO   Complications:  None  Brief history: The patient desires injection with fillers in her face. I discussed with the patient this proposed procedure of filler injections, which is customized depending on the particular needs of the patient. It is performed on facial volume loss as a temporary correction. The alternatives were discussed with the patient. The risks were addressed including bleeding, scarring, infection, damage to deeper structures, asymmetry, and chronic pain, which may occur infrequently after a procedure. The individual's choice to undergo a surgical procedure is based on the comparison of risks to potential benefits. Other risks include unsatisfactory results, allergic reaction, which should go away with time, bruising and delayed healing. Fillers do not arrest the aging process or produce permanent tightening.  Operative intervention maybe necessary to maintain the results. The patient understands and wishes to proceed. An informed consent was signed and informational brochures given to her prior to the procedure.  Procedure: The area was prepped with chlorhexidine and dried with a clean gauze. Using a clean technique, the midface area was injected at the medial sub-region of the mid-face.  The majority of the syringe was placed on the right side compared to the left because of the asymmetry the patient noticed.  The difference was 0.3 cc. Light pressure was held for 5 minutes. She was instructed explicitly in post-operative care.  Voluma LOT: [id number] EXP: 2021-06-19  Pictures were obtained of the patient and placed in the chart with the patient's or guardian's permission.

## 2020-06-16 ENCOUNTER — Telehealth: Payer: Self-pay

## 2020-06-16 NOTE — Telephone Encounter (Signed)
Patient called requesting repeat prescription of tretinoin (RETIN-A) 0.1 % cream.

## 2020-06-17 MED ORDER — TRETINOIN 0.1 % EX CREA: TOPICAL_CREAM | Freq: Every day | CUTANEOUS | 1 refills | Status: DC

## 2020-06-17 NOTE — Telephone Encounter (Signed)
Prescription approved per Dr. Holly Bodily refill sent to [facility] [place].//AB/CMA

## 2020-06-20 NOTE — Telephone Encounter (Signed)
Called and [facility] @ 4:06pm asking the patient to RTC regarding refill request for Retin-A 0.1% cream.//AB/CMA

## 2020-06-21 NOTE — Telephone Encounter (Signed)
Called and [facility] @ 3:06pm asking the patient to RTC regarding medication refill request.//AB/CMA

## 2020-06-24 ENCOUNTER — Telehealth: Payer: Self-pay

## 2020-06-24 NOTE — Telephone Encounter (Signed)
Received call from Ms. Keel. She states that her and Angie, CMA keep missing each other. She has requested a call back to confirm that her requested prescription has been sent to [facility] in [place] and if she is able to go and pick it up. She has given permission to leave a detailed voicemail if she does not answer.

## 2020-06-27 ENCOUNTER — Telehealth: Payer: Self-pay

## 2020-06-27 MED ORDER — TRETINOIN 0.1 % EX CREA: TOPICAL_CREAM | Freq: Every day | CUTANEOUS | 1 refills | Status: DC

## 2020-06-27 NOTE — Telephone Encounter (Signed)
Patient called to say that she went to the pharmacy on Saturday to pick up her prescription for retin A and they didn't have it.  Please call.

## 2020-06-27 NOTE — Telephone Encounter (Signed)
Resent the prescription for Retina-A 0.1% cream to [facility].   Called the pharmacy to see if they received the prescription.  Was informed that the prescription has not come through yet, but was told it might take about 30 mins for it to be received at the pharmacy.    Called and spoke with the patient and informed her that the prescription was sent in and I called the pharmacy to see if they had received it.  And was told it had not been received yet.  But it will take about 30 mins.    Asked the patient to wait awhile and then give the pharmacy a call to see if they have received it.  Also asked the patient to give me a call back if the pharmacy still have not received it.  Patient verbalized understanding and agreed.//AB/CMA

## 2020-06-28 NOTE — Telephone Encounter (Signed)
Called and LMOM on (06/24/20) informing the patient that I spoke with the pharmacy and with the discount card the Retin-A will cost her ($94.99) out of pocket.     Asked the patient to give the pharmacy a call and speak with them of what she would like to do.  Asked her to give me a call back if she has any other questions.//AB/CMA

## 2020-06-28 NOTE — Telephone Encounter (Signed)
Called and spoke with the patient to inform her that the pharmacy has the prescription.  She can give them a call to let them know what she would like to do.  Patient verbalized understanding and agreed.//AB/CMA

## 2020-07-11 ENCOUNTER — Telehealth: Payer: Self-pay

## 2020-07-11 NOTE — Telephone Encounter (Signed)
Patient called with message regarding Prior Authorization for the Retina-A prescription. Patient states the pharmacy should have faxed a PA form and she has requested we fill this out so the prescription can be authorized.

## 2020-07-18 NOTE — Telephone Encounter (Signed)
Called and spoke with the patient and informed her that the faxed PA form they we received from the pharmacy was covered alternative may be available.  If clinically appropriate, you may change the prescription.    Patient does not want to change the prescription.    Informed the patient that we have Retin-A cream here in the office, but the dose is different and it cost more.  Also informed her that we check to see how much it would  cost if we purchased it from our pharmacy and it would be ($160.00) with the 20% discount.    Patient verbalized understanding and stated that she will just get it at [facility].//AB/CMA

## 2020-09-06 ENCOUNTER — Encounter: Payer: Medicare Other | Admitting: Plastic Surgery

## 2020-11-01 ENCOUNTER — Ambulatory Visit (INDEPENDENT_AMBULATORY_CARE_PROVIDER_SITE_OTHER): Payer: Self-pay | Admitting: Plastic Surgery

## 2020-11-01 ENCOUNTER — Other Ambulatory Visit: Payer: Self-pay

## 2020-11-01 ENCOUNTER — Encounter: Payer: Self-pay | Admitting: Plastic Surgery

## 2020-11-01 VITALS — BP 125/69 | HR 69

## 2020-11-01 DIAGNOSIS — Z719 Counseling, unspecified: Secondary | ICD-10-CM

## 2020-11-01 NOTE — Progress Notes (Signed)

## 2020-11-29 NOTE — Progress Notes (Signed)
Cardiology Office Note:    Date:  11/30/2020   ID:  Jamie Stephens, DOB February 04, 1946, MRN [id number]  PCP:  Eula Fried, FNP  Cardiologist:  Shirlee More, MD    Referring MD: Emeterio Reeve, DO    ASSESSMENT:    1. APC (atrial premature contractions)   2. COVID-19    PLAN:    In order of problems listed above:  1. Improved asymptomatic no longer requiring beta-blocker see back in the office as needed and she can communicate through my chart. 2. She is hesitant to receive the booster and I think and she has had trouble getting 2 doses of Pfizer in her case this is likely adequate   Next appointment: As needed  Cardiology Office Note:    Date:  11/30/2020   ID:  Jamie Stephens, DOB 21-Jun-1946, MRN [id number]  PCP:  Eula Fried, FNP  Cardiologist:  Shirlee More, MD    Referring MD: Emeterio Reeve, DO    Medication Adjustments/Labs and Tests Ordered: Current medicines are reviewed at length with the patient today.  Concerns regarding medicines are outlined above.  Orders Placed This Encounter  Procedures  . EKG 12-Lead   No orders of the defined types were placed in this encounter.   Chief Complaint  Patient presents with  . Follow-up    Symptomatic APCs    History of Present Illness:    Jamie Stephens is a 75 y.o. female with a hx of palpitation with occasional atrial premature beats and brief atrial tachycardia last seen 08/17/2019. Compliance with diet, lifestyle and medications: Yes  She is doing quite well and is not having palpitations. Last May June she had COVID-19 outpatient afterwards had anxiety depression went back on Celexa very interestingly her palpitations resolved Does not require her beta-blocker shortness of breath chest pain palpitation or syncope.  She is vaccinated. Past Medical History:  Diagnosis Date  . Anxiety disorder 03/02/2013  . 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 Dry eye 06/24/2016  . Family history of colon  cancer 03/02/2013  . GERD (gastroesophageal reflux disease) 02/28/2011  . Kidney stone 09/01/2013  . Non-rheumatic mitral regurgitation 06/24/2016  . Osteopenia 06/22/2016  . Osteoporosis without current pathological fracture 06/24/2016  . Palpitations 06/24/2016   normal EKG, infrequent "heart flutters," normal previous exch except mild MR an dno murmur today, labs pending, pt will consider Holter/repeat Echo    Past Surgical History:  Procedure Laterality Date  . ABDOMINAL HYSTERECTOMY  2014   PARTIAL W/BLADDER TACK  . BREAST CYST ASPIRATION Bilateral   . OVARIAN CYST REMOVAL  2004    Current Medications: Current Meds  Medication Sig  . alendronate (FOSAMAX) 35 MG tablet Take 1 tablet (35 mg total) by mouth every 7 (seven) days.  . citalopram (CELEXA) 20 MG tablet Take 20 mg by mouth daily.  . Multiple Vitamins-Minerals (MULTIVITAMIN WITH MINERALS) tablet Take 1 tablet by mouth daily.  . Omega-3 Fatty Acids (FISH OIL) 1000 MG CAPS Take 2 g by mouth daily.  . RESTASIS 0.05 % ophthalmic emulsion 1 drop 2 (two) times daily.  Marland Kitchen tretinoin (RETIN-A) 0.1 % cream Apply topically at bedtime.     Allergies:   Patient has no known allergies.   Social History   Socioeconomic History  . Marital status: Widowed    Spouse name: Not on file  . Number of children: Not on file  . Years of education: Not on file  . Highest education level: Not on file  Occupational History  .  Not on file  Tobacco Use  . Smoking status: Never Smoker  . Smokeless tobacco: Never Used  Vaping Use  . Vaping Use: Never used  Substance and Sexual Activity  . Alcohol use: Not Currently  . Drug use: Never  . Sexual activity: Not on file  Other Topics Concern  . Not on file  Social History Narrative  . Not on file   Social Determinants of Health   Financial Resource Strain: Not on file  Food Insecurity: Not on file  Transportation Needs: Not on file  Physical Activity: Not on file  Stress: Not on file   Social Connections: Not on file     Family History: The patient's family history includes Cancer in her mother; Congestive Heart Failure in her father; Heart attack in her paternal grandfather and paternal grandmother. There is no history of Breast cancer. ROS:   Please see the history of present illness.    All other systems reviewed and are negative.  EKGs/Labs/Other Studies Reviewed:    The following studies were reviewed today:  EKG:  EKG ordered today and personally reviewed.  The ekg ordered today demonstrates sinus rhythm low voltage  Recent Labs: No results found for requested labs within last 8760 hours.  Recent Lipid Panel    Component Value Date/Time   CHOL 195 07/04/2017 1044   TRIG 105 07/04/2017 1044   HDL 74 07/04/2017 1044   CHOLHDL 2.6 07/04/2017 1044   VLDL 15 06/25/2016 0926   LDLCALC 101 (H) 07/04/2017 1044    Physical Exam:    VS:  BP 106/60   Pulse 66   Ht 5\' 2"  (1.575 m)   Wt 125 lb (56.7 kg)   SpO2 96%   BMI 22.86 kg/m     Wt Readings from Last 3 Encounters:  11/30/20 125 lb (56.7 kg)  10/27/19 125 lb 9.6 oz (57 kg)  09/15/19 125 lb (56.7 kg)     GEN:  Well nourished, well developed in no acute distress HEENT: Normal NECK: No JVD; No carotid bruits LYMPHATICS: No lymphadenopathy CARDIAC: **RRR, no murmurs, rubs, gallops RESPIRATORY:  Clear to auscultation without rales, wheezing or rhonchi  ABDOMEN: Soft, non-tender, non-distended MUSCULOSKELETAL:  No edema; No deformity  SKIN: Warm and dry NEUROLOGIC:  Alert and oriented x 3 PSYCHIATRIC:  Normal affect    Signed, Shirlee More, MD  11/30/2020 2:46 PM    [facility]

## 2020-11-30 ENCOUNTER — Ambulatory Visit: Payer: Medicare Other | Admitting: Cardiology

## 2020-11-30 ENCOUNTER — Other Ambulatory Visit: Payer: Self-pay

## 2020-11-30 ENCOUNTER — Encounter: Payer: Self-pay | Admitting: Cardiology

## 2020-11-30 VITALS — BP 106/60 | HR 66 | Ht 62.0 in | Wt 125.0 lb

## 2020-11-30 DIAGNOSIS — I491 Atrial premature depolarization: Secondary | ICD-10-CM

## 2020-11-30 DIAGNOSIS — U071 COVID-19: Secondary | ICD-10-CM | POA: Diagnosis not present

## 2020-11-30 NOTE — Patient Instructions (Addendum)
Medication Instructions:  Your physician recommends that you continue on your current medications as directed. Please refer to the Current Medication list given to you today.  *If you need a refill on your cardiac medications before your next appointment, please call your pharmacy*   Lab Work: None If you have labs (blood work) drawn today and your tests are completely normal, you will receive your results only by: Marland Kitchen MyChart Message (if you have MyChart) OR . A paper copy in the mail If you have any lab test that is abnormal or we need to change your treatment, we will call you to review the results.   Testing/Procedures: None   Follow-Up: At [facility], you and your health needs are our priority.  As part of our continuing mission to provide you with exceptional heart care, we have created designated Provider Care Teams.  These Care Teams include your primary Cardiologist (physician) and Advanced Practice Providers (APPs -  Physician Assistants and Nurse Practitioners) who all work together to provide you with the care you need, when you need it.  We recommend signing up for the patient portal called "MyChart".  Sign up information is provided on this After Visit Summary.  MyChart is used to connect with patients for Virtual Visits (Telemedicine).  Patients are able to view lab/test results, encounter notes, upcoming appointments, etc.  Non-urgent messages can be sent to your provider as well.   To learn more about what you can do with MyChart, go to [email or web address].    Your next appointment:   As needed  The format for your next appointment:   In Person  Provider:   Shirlee More, MD   Other Instructions 1. Avoid all over-the-counter antihistamines except Claritin/Loratadine and Zyrtec/Cetrizine. 2. Avoid all combination including cold sinus allergies flu decongestant and sleep medications 3. You can use Robitussin DM Mucinex and Mucinex DM for cough. 4. can use  Tylenol aspirin ibuprofen and naproxen but no combinations such as sleep or sinus.

## 2020-12-06 ENCOUNTER — Other Ambulatory Visit: Payer: Self-pay

## 2020-12-06 ENCOUNTER — Ambulatory Visit (INDEPENDENT_AMBULATORY_CARE_PROVIDER_SITE_OTHER): Payer: Self-pay | Admitting: Plastic Surgery

## 2020-12-06 ENCOUNTER — Encounter: Payer: Self-pay | Admitting: Plastic Surgery

## 2020-12-06 VITALS — BP 120/63

## 2020-12-06 DIAGNOSIS — Z719 Counseling, unspecified: Secondary | ICD-10-CM

## 2020-12-06 NOTE — Progress Notes (Signed)
Preoperative Dx: hyperpigmentation  Postoperative Dx:  same  Procedure: laser to face   Anesthesia: none  Description of Procedure:  Risks and complications were explained to the patient. Consent was confirmed and signed. Time out was called and all information was confirmed to be correct. The area  area was prepped with alcohol and wiped dry. The IPL laser was set at 7.2 J/cm2. The face was lasered. The patient tolerated the procedure well and there were no complications. The patient is to follow up in 4 weeks.

## 2021-05-12 ENCOUNTER — Encounter: Payer: Self-pay | Admitting: Plastic Surgery

## 2021-05-12 ENCOUNTER — Other Ambulatory Visit: Payer: Self-pay

## 2021-05-12 ENCOUNTER — Ambulatory Visit (INDEPENDENT_AMBULATORY_CARE_PROVIDER_SITE_OTHER): Payer: Self-pay | Admitting: Plastic Surgery

## 2021-05-12 DIAGNOSIS — Z719 Counseling, unspecified: Secondary | ICD-10-CM

## 2021-05-12 NOTE — Progress Notes (Signed)
Botulinum Toxin Injection Procedure Note  Procedure: Cosmetic botulinum toxin   Pre-operative Diagnosis: Dynamic rhytides   Post-operative Diagnosis: Same  Complications:  None  Brief history: The patient desires botulinum toxin injection of her forehead. I discussed with the patient this proposed procedure of botulinum toxin injections, which is customized depending on the particular needs of the patient. It is performed on facial rhytids as a temporary correction. The alternatives were discussed with the patient. The risks were addressed including bleeding, scarring, infection, damage to deeper structures, asymmetry, and chronic pain, which may occur infrequently after a procedure. The individual's choice to undergo a surgical procedure is based on the comparison of risks to potential benefits. Other risks include unsatisfactory results, brow ptosis, eyelid ptosis, allergic reaction, temporary paralysis, which should go away with time, bruising, blurring disturbances and delayed healing. Botulinum toxin injections do not arrest the aging process or produce permanent tightening of the eyelid.  Operative intervention maybe necessary to maintain the results of a blepharoplasty or botulinum toxin. The patient understands and wishes to proceed.  Procedure: The area was prepped with alcohol and dried with a clean gauze. Using a clean technique, the botulinum toxin was diluted with 1.25 cc of preservative-free normal saline which was slowly injected with an 18 gauge needle in a tuberculin syringes.  A 32 gauge needles were then used to inject the botulinum toxin. This mixture allow for an aliquot of 5 units per 0.1 cc in each injection site.    Subsequently the mixture was injected in the glabellar and forehead area with preservation of the temporal branch to the lateral eyebrow as well as into each lateral canthal area beginning from the lateral orbital rim medial to the zygomaticus major in 3 separate  areas. A total of 32 Units of botulinum toxin was used. The forehead and glabellar area was injected with care to inject intramuscular only while holding pressure on the supratrochlear vessels in each area during each injection on either side of the medial corrugators. The injection proceeded vertically superiorly to the medial 2/3 of the frontalis muscle and superior 2/3 of the lateral frontalis, again with preservation of the frontal branch.  No complications were noted. Light pressure was held for 5 minutes. She was instructed explicitly in post-operative care.  Botox LOT:  [id number] EXP:  7/24

## 2021-05-30 ENCOUNTER — Other Ambulatory Visit: Payer: Medicare Other | Admitting: Surgical

## 2021-06-08 ENCOUNTER — Other Ambulatory Visit: Payer: Medicare Other | Admitting: Surgical

## 2021-06-29 ENCOUNTER — Other Ambulatory Visit: Payer: Self-pay

## 2021-06-29 ENCOUNTER — Ambulatory Visit (INDEPENDENT_AMBULATORY_CARE_PROVIDER_SITE_OTHER): Payer: Medicare Other | Admitting: Surgical

## 2021-06-29 ENCOUNTER — Encounter: Payer: Self-pay | Admitting: Surgical

## 2021-06-29 DIAGNOSIS — Z719 Counseling, unspecified: Secondary | ICD-10-CM

## 2021-06-29 NOTE — Progress Notes (Signed)
Preoperative Dx: Hyperpigmentation and redness  Postoperative Dx: Same  Procedure: Laser to face  Provider: Roetta Sessions, PA-C  Anesthesia: Emla lidocaine cream   Description of Procedure: Risks and complications were explained to the patient.  Consent was confirmed.  The area was prepped with alcohol and wiped dry.  The IPL laser handpiece was set to PR and the laser was set to 7.8 J/cm.  The face was lasered.  The patient tolerated this well and there were no complications.  All of her questions were answered to her content.

## 2021-08-22 ENCOUNTER — Encounter: Payer: Self-pay | Admitting: Plastic Surgery

## 2021-12-15 ENCOUNTER — Ambulatory Visit (INDEPENDENT_AMBULATORY_CARE_PROVIDER_SITE_OTHER): Payer: Self-pay | Admitting: Plastic Surgery

## 2021-12-15 ENCOUNTER — Encounter: Payer: Self-pay | Admitting: Plastic Surgery

## 2021-12-15 DIAGNOSIS — Z719 Counseling, unspecified: Secondary | ICD-10-CM

## 2021-12-15 NOTE — Progress Notes (Signed)
Botulinum Toxin Procedure Note ? ?Procedure: Cosmetic botulinum toxin ? ?Pre-operative Diagnosis: Dynamic rhytides  ?Post-operative Diagnosis: Same ? ?Complications:  None ? ?Brief history: The patient desires botulinum toxin injection of her forehead. I discussed with the patient this proposed procedure of botulinum toxin injections, which is customized depending on the particular needs of the patient. It is performed on facial rhytids as a temporary correction. The alternatives were discussed with the patient. The risks were addressed including bleeding, scarring, infection, damage to deeper structures, asymmetry, and chronic pain, which may occur infrequently after a procedure. The individual's choice to undergo a surgical procedure is based on the comparison of risks to potential benefits. Other risks include unsatisfactory results, brow ptosis, eyelid ptosis, allergic reaction, temporary paralysis, which should go away with time, bruising, blurring disturbances and delayed healing. Botulinum toxin injections do not arrest the aging process or produce permanent tightening of the eyelid.  Operative intervention maybe necessary to maintain the results of a blepharoplasty or botulinum toxin. The patient understands and wishes to proceed. ? ?Procedure: The area was prepped with alcohol and dried with a clean gauze. Using a clean technique, the botulinum toxin was diluted with 1.25 cc of preservative-free normal saline which was slowly injected with an 18 gauge needle in a tuberculin syringes.  A 32 gauge needles were then used to inject the botulinum toxin. This mixture allow for an aliquot of 4 units per 0.1 cc in each injection site.   ? ?Subsequently the mixture was injected in the glabellar and forehead area with preservation of the temporal branch to the lateral eyebrow as well as into each lateral canthal area beginning from the lateral orbital rim medial to the zygomaticus major in 3 separate areas. A total  of 32 Units of botulinum toxin was used. The forehead and glabellar area was injected with care to inject intramuscular only while holding pressure on the supratrochlear vessels in each area during each injection on either side of the medial corrugators. The injection proceeded vertically superiorly to the medial 2/3 of the frontalis muscle and superior 2/3 of the lateral frontalis, again with preservation of the frontal branch.  No complications were noted. Light pressure was held for 5 minutes. She was instructed explicitly in post-operative care. ? ?Botox ?LOT:  [id number] ?EXP:  25/04 ?

## 2022-07-12 NOTE — Progress Notes (Deleted)
Botulinum Toxin Procedure Note   Procedure: Cosmetic botulinum toxin   Pre-operative Diagnosis: Dynamic rhytides   Post-operative Diagnosis: Same   Complications:  None   Brief history: The patient desires botulinum toxin injection.  She is aware of the risks including bleeding, damage to deeper structures, asymmetry, brow ptosis, eyelid ptosis, bruising. The patient understands and wishes to proceed.   Procedure: The area was prepped with alcohol and dried with a clean gauze.  Using a clean technique the botulinum toxin was diluted with 2.5 mL of bacteriostatic saline per 100 unit vial which resulted in 4 units per 0.1 mL.   Subsequently the mixture was injected in the glabellar, forehead area with preservation of the temporal branch to the lateral eyebrow. A total of *** Units of botulinum toxin was used. The forehead and glabellar area was injected with care to inject intramuscular only while holding pressure on the supratrochlear vessels in each area during each injection on either side of the medial corrugators. The injection proceeded vertically superiorly to the medial 2/3 of the frontalis muscle and superior 2/3 of the lateral frontalis, again with preservation of the frontal branch.   The forehead was injected in a standard 2 line pattern. ***   No complications were noted. Light pressure was held for 5 minutes. She was instructed explicitly in post-operative care.   Botox LOT:  *** EXP:  ***

## 2022-07-13 ENCOUNTER — Encounter: Payer: Medicare Other | Admitting: Physician Assistant

## 2022-07-16 NOTE — Progress Notes (Deleted)
Botulinum Toxin Procedure Note   Procedure: Cosmetic botulinum toxin   Pre-operative Diagnosis: Dynamic rhytides   Post-operative Diagnosis: Same   Complications:  None   Brief history: The patient desires botulinum toxin injection.  She is aware of the risks including bleeding, damage to deeper structures, asymmetry, brow ptosis, eyelid ptosis, bruising. The patient understands and wishes to proceed.  She was last seen for Botox 12/15/2021.  At that time, a total of 32 units of Botox was injected.  These were distributed to her lateral canthal regions as well as glabellar and forehead rhytids.  Today, ***   Procedure: The area was prepped with alcohol and dried with a clean gauze.  Using a clean technique the botulinum toxin was diluted with 2.5 mL of bacteriostatic saline per 100 unit vial which resulted in 4 units per 0.1 mL.   Subsequently the mixture was injected in the glabellar, forehead area with preservation of the temporal branch to the lateral eyebrow. A total of *** Units of botulinum toxin was used. The forehead, glabella and crows feet were prepped and injected in a standard injection pattern using 3 injection points for the crows feet, 5-point V-shape glabellar/procerus injection, and 2 rows for the forehead.  They tolerated this fine.      No complications were noted. Light pressure was held for 5 minutes. She was instructed explicitly in post-operative care.   Botox LOT:  *** EXP:  ***

## 2022-07-17 ENCOUNTER — Encounter: Payer: Medicare Other | Admitting: Physician Assistant

## 2022-07-18 ENCOUNTER — Encounter: Payer: Medicare Other | Admitting: Physician Assistant

## 2022-07-20 ENCOUNTER — Encounter: Payer: Self-pay | Admitting: Plastic Surgery

## 2022-07-20 ENCOUNTER — Telehealth: Payer: Self-pay | Admitting: Plastic Surgery

## 2022-07-20 ENCOUNTER — Ambulatory Visit (INDEPENDENT_AMBULATORY_CARE_PROVIDER_SITE_OTHER): Payer: Self-pay | Admitting: Plastic Surgery

## 2022-07-20 VITALS — BP 110/64 | HR 75

## 2022-07-20 DIAGNOSIS — Z719 Counseling, unspecified: Secondary | ICD-10-CM

## 2022-07-20 NOTE — Telephone Encounter (Signed)
Wanting to see if Dr. Marla Roe can send prescription for RETIN-A for her

## 2022-07-23 NOTE — Progress Notes (Signed)

## 2022-07-24 ENCOUNTER — Telehealth: Payer: Self-pay | Admitting: Plastic Surgery

## 2022-07-24 NOTE — Telephone Encounter (Signed)
Called before but no reply on Retin A prescription being called in.  Pt would like to have someone give her a call back regarding this prescription.

## 2022-07-25 MED ORDER — TRETINOIN 0.1 % EX CREA: TOPICAL_CREAM | Freq: Every day | CUTANEOUS | 1 refills | Status: AC

## 2022-07-25 NOTE — Telephone Encounter (Signed)
Rx for Retin-A refilled, discussed with Dr. Marla Roe.

## 2022-07-31 ENCOUNTER — Other Ambulatory Visit: Payer: Self-pay | Admitting: Plastic Surgery

## 2022-08-02 NOTE — Telephone Encounter (Signed)
Prescription was sent to the pharmacy on (07/25/22) by Matthew,PA-C.    Received note from the pharmacy stating that the prescription will need Prior Auth before they can fill the prescription.  Called and spoke with the patient regarding the request for the Prior Auth.  Patient stated that she pays out of pocket for the prescription,so we will not need to do the Prior Auth.//AB/CMA

## 2022-10-30 ENCOUNTER — Encounter: Payer: Medicare HMO | Admitting: Plastic Surgery

## 2023-02-04 ENCOUNTER — Ambulatory Visit (INDEPENDENT_AMBULATORY_CARE_PROVIDER_SITE_OTHER): Payer: Self-pay | Admitting: Plastic Surgery

## 2023-02-04 DIAGNOSIS — Z719 Counseling, unspecified: Secondary | ICD-10-CM

## 2023-02-04 NOTE — Progress Notes (Signed)
Botulinum Toxin Procedure Note  Procedure: Cosmetic botulinum toxin   Pre-operative Diagnosis: Dynamic rhytides   Post-operative Diagnosis: Same  Complications:  None  Brief history: The patient desires botulinum toxin injection of her forehead. I discussed with the patient this proposed procedure of botulinum toxin injections, which is customized depending on the particular needs of the patient. It is performed on facial rhytids as a temporary correction. The alternatives were discussed with the patient. The risks were addressed including bleeding, scarring, infection, damage to deeper structures, asymmetry, and chronic pain, which may occur infrequently after a procedure. The individual's choice to undergo a surgical procedure is based on the comparison of risks to potential benefits. Other risks include unsatisfactory results, brow ptosis, eyelid ptosis, allergic reaction, temporary paralysis, which should go away with time, bruising, blurring disturbances and delayed healing. Botulinum toxin injections do not arrest the aging process or produce permanent tightening of the eyelid.  Operative intervention maybe necessary to maintain the results of a blepharoplasty or botulinum toxin. The patient understands and wishes to proceed.  Procedure: The area was prepped with alcohol and dried with a clean gauze. Using a clean technique, the botulinum toxin was diluted with 1.25 cc of preservative-free normal saline which was slowly injected with an 18 gauge needle in a tuberculin syringes.  A 32 gauge needles were then used to inject the botulinum toxin. This mixture allow for an aliquot of 4 units per 0.1 cc in each injection site.    Subsequently the mixture was injected in the glabellar and forehead area with preservation of the temporal branch to the lateral eyebrow as well as into each lateral canthal area beginning from the lateral orbital rim medial to the zygomaticus major in 3 separate areas. A  total of 32 Units of botulinum toxin was used. The forehead and glabellar area was injected with care to inject intramuscular only while holding pressure on the supratrochlear vessels in each area during each injection on either side of the medial corrugators. The injection proceeded vertically superiorly to the medial 2/3 of the frontalis muscle and superior 2/3 of the lateral frontalis, again with preservation of the frontal branch. No complications were noted. Light pressure was held for 5 minutes. She was instructed explicitly in post-operative care.  Botox LOT:  [id number] 

## 2023-03-26 ENCOUNTER — Ambulatory Visit (INDEPENDENT_AMBULATORY_CARE_PROVIDER_SITE_OTHER): Payer: Self-pay | Admitting: Plastic Surgery

## 2023-03-26 DIAGNOSIS — Z719 Counseling, unspecified: Secondary | ICD-10-CM

## 2023-03-26 NOTE — Progress Notes (Signed)
Preoperative Dx: hyperpigmentation of face  Postoperative Dx:  same  Procedure: laser to face   Anesthesia: none  Description of Procedure:  Risks and complications were explained to the patient. Consent was confirmed and signed. Eye protection was placed. Time out was called and all information was confirmed to be correct. The area  area was prepped with alcohol and wiped dry. The BBL Moxy laser was set at 532 nm. The face was lasered. The patient tolerated the procedure well and there were no complications. The patient is to follow up in 4 weeks.

## 2023-06-12 ENCOUNTER — Ambulatory Visit (INDEPENDENT_AMBULATORY_CARE_PROVIDER_SITE_OTHER): Payer: Self-pay | Admitting: Surgical

## 2023-06-12 DIAGNOSIS — Z719 Counseling, unspecified: Secondary | ICD-10-CM

## 2023-06-12 NOTE — Progress Notes (Signed)
Botulinum Toxin Procedure Note  Procedure: Cosmetic botulinum toxin  Pre-operative Diagnosis: Dynamic rhytides  Post-operative Diagnosis: Same  Complications:  None  Brief history: The patient desires botulinum toxin injection.  She is aware of the risks including bleeding, damage to deeper structures, asymmetry, brow ptosis, eyelid ptosis, bruising. The patient understands and wishes to proceed.  Procedure: The area was prepped with alcohol and dried with a clean gauze.  Using a clean technique the botulinum toxin was diluted with 2.5 mL of bacteriostatic saline per 100 unit vial which resulted in 4 units per 0.1 mL.  Subsequently the mixture was injected in the lateral canthal lines. total of 12 Units of botulinum toxin was used.  No complications were noted. Light pressure was held for 5 minutes. She was instructed explicitly in post-operative care.  Botox LOT:  [id number] EXP:  05/2025

## 2023-06-18 ENCOUNTER — Other Ambulatory Visit: Payer: Medicare HMO | Admitting: Plastic Surgery

## 2023-11-21 ENCOUNTER — Ambulatory Visit (INDEPENDENT_AMBULATORY_CARE_PROVIDER_SITE_OTHER): Payer: Self-pay | Admitting: Surgical

## 2023-11-21 DIAGNOSIS — Z719 Counseling, unspecified: Secondary | ICD-10-CM

## 2023-11-21 NOTE — Progress Notes (Signed)
 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  She states it was 5-FU cream.  She reports she has not started to take this because she wanted to do the BBL laser first.  We discussed that she needs to discuss this further with her dermatologist if she does not want to use the cream, but we discussed that the BBL was safe to do today.  Description of Procedure:  Risks and complications were explained to the patient. Consent was confirmed and signed. Time out was called and all information was confirmed to be correct. The area  area was prepped with alcohol and wiped dry. The patient tolerated the procedure well and there were no complications. The patient is to follow up in 4 weeks.  Patient underwent base passes with the 532 filter followed by facial vessel treatment with the 532 filter followed by 532 filter for rosacea and the 515 filter for hyperpigmentation spots.  She tolerated this well.  She was treated as a Luan Pulling level 1-2

## 2023-12-04 ENCOUNTER — Encounter: Payer: Self-pay | Admitting: Surgical

## 2023-12-05 ENCOUNTER — Ambulatory Visit (INDEPENDENT_AMBULATORY_CARE_PROVIDER_SITE_OTHER): Payer: Self-pay | Admitting: Surgical

## 2023-12-05 DIAGNOSIS — Z719 Counseling, unspecified: Secondary | ICD-10-CM

## 2023-12-05 NOTE — Progress Notes (Signed)
 Botulinum Toxin Procedure Note  Procedure: Cosmetic botulinum toxin  Pre-operative Diagnosis: Dynamic rhytides  Post-operative Diagnosis: Same  Complications:  None  Brief history: The patient desires botulinum toxin injection.  She is aware of the risks including bleeding, damage to deeper structures, asymmetry, brow ptosis, eyelid ptosis, bruising. The patient understands and wishes to proceed.  Procedure: The area was prepped with alcohol and dried with a clean gauze.  Using a clean technique the botulinum toxin was diluted with 2.5 mL of bacteriostatic saline per 100 unit vial which resulted in 4 units per 0.1 mL.  Subsequently the mixture was injected in the glabellar, lateral [place]hal lines. A total of 22 Units of botulinum toxin was used. The glabellar area was injected with care to inject intramuscular only while holding pressure on the supratrochlear vessels in each area during each injection on either side of the medial corrugators. The injection proceeded vertically superiorly to the medial 2/3 of the frontalis muscle and superior 2/3 of the lateral frontalis, again with preservation of the frontal branch.  No complications were noted. Light pressure was held for 5 minutes. She was instructed explicitly in post-operative care.  Botox LOT: [id number] EXP:  11/2025

## 2024-07-31 ENCOUNTER — Ambulatory Visit (INDEPENDENT_AMBULATORY_CARE_PROVIDER_SITE_OTHER): Payer: Self-pay | Admitting: Plastic Surgery

## 2024-07-31 DIAGNOSIS — Z719 Counseling, unspecified: Secondary | ICD-10-CM

## 2024-07-31 NOTE — Progress Notes (Signed)
